# Patient Record
Sex: Female | Born: 1960
Health system: Southern US, Community
[De-identification: ages and names within clinical notes are randomized; demographics above are authoritative.]

## PROBLEM LIST (undated history)

## (undated) DIAGNOSIS — E785 Hyperlipidemia, unspecified: Secondary | ICD-10-CM

## (undated) DIAGNOSIS — E039 Hypothyroidism, unspecified: Secondary | ICD-10-CM

## (undated) DIAGNOSIS — IMO0001 Reserved for inherently not codable concepts without codable children: Secondary | ICD-10-CM

## (undated) DIAGNOSIS — K219 Gastro-esophageal reflux disease without esophagitis: Secondary | ICD-10-CM

## (undated) DIAGNOSIS — K449 Diaphragmatic hernia without obstruction or gangrene: Secondary | ICD-10-CM

## (undated) HISTORY — DX: Hypothyroidism, unspecified: E03.9

## (undated) HISTORY — DX: Gastro-esophageal reflux disease without esophagitis: K21.9

## (undated) HISTORY — DX: Hyperlipidemia, unspecified: E78.5

## (undated) HISTORY — DX: Diaphragmatic hernia without obstruction or gangrene: K44.9

## (undated) HISTORY — PX: OTHER SURGICAL HISTORY: SHX169

## (undated) HISTORY — DX: Reserved for inherently not codable concepts without codable children: IMO0001

---

## 1998-06-28 ENCOUNTER — Other Ambulatory Visit: Admission: RE | Admit: 1998-06-28 | Discharge: 1998-06-28 | Payer: Self-pay | Admitting: Obstetrics and Gynecology

## 1999-07-04 ENCOUNTER — Other Ambulatory Visit: Admission: RE | Admit: 1999-07-04 | Discharge: 1999-07-04 | Payer: Self-pay | Admitting: Obstetrics and Gynecology

## 1999-12-04 ENCOUNTER — Encounter: Admission: RE | Admit: 1999-12-04 | Discharge: 1999-12-04 | Payer: Self-pay | Admitting: Family Medicine

## 1999-12-04 ENCOUNTER — Encounter: Payer: Self-pay | Admitting: Family Medicine

## 2000-07-07 ENCOUNTER — Other Ambulatory Visit: Admission: RE | Admit: 2000-07-07 | Discharge: 2000-07-07 | Payer: Self-pay | Admitting: Obstetrics and Gynecology

## 2001-07-08 ENCOUNTER — Other Ambulatory Visit: Admission: RE | Admit: 2001-07-08 | Discharge: 2001-07-08 | Payer: Self-pay | Admitting: Obstetrics and Gynecology

## 2002-07-13 ENCOUNTER — Other Ambulatory Visit: Admission: RE | Admit: 2002-07-13 | Discharge: 2002-07-13 | Payer: Self-pay | Admitting: Obstetrics and Gynecology

## 2003-07-24 ENCOUNTER — Other Ambulatory Visit: Admission: RE | Admit: 2003-07-24 | Discharge: 2003-07-24 | Payer: Self-pay | Admitting: Obstetrics and Gynecology

## 2004-09-11 ENCOUNTER — Other Ambulatory Visit: Admission: RE | Admit: 2004-09-11 | Discharge: 2004-09-11 | Payer: Self-pay | Admitting: Obstetrics and Gynecology

## 2005-09-12 ENCOUNTER — Other Ambulatory Visit: Admission: RE | Admit: 2005-09-12 | Discharge: 2005-09-12 | Payer: Self-pay | Admitting: Obstetrics and Gynecology

## 2006-04-21 ENCOUNTER — Ambulatory Visit: Payer: Self-pay | Admitting: Family Medicine

## 2006-06-09 ENCOUNTER — Ambulatory Visit: Payer: Self-pay | Admitting: Family Medicine

## 2006-09-17 ENCOUNTER — Other Ambulatory Visit: Admission: RE | Admit: 2006-09-17 | Discharge: 2006-09-17 | Payer: Self-pay | Admitting: Obstetrics and Gynecology

## 2007-08-04 ENCOUNTER — Ambulatory Visit: Payer: Self-pay | Admitting: Family Medicine

## 2007-09-07 ENCOUNTER — Ambulatory Visit: Payer: Self-pay | Admitting: Family Medicine

## 2007-09-21 ENCOUNTER — Other Ambulatory Visit: Admission: RE | Admit: 2007-09-21 | Discharge: 2007-09-21 | Payer: Self-pay | Admitting: Obstetrics and Gynecology

## 2007-11-17 ENCOUNTER — Ambulatory Visit: Payer: Self-pay | Admitting: Family Medicine

## 2008-03-27 ENCOUNTER — Ambulatory Visit: Payer: Self-pay | Admitting: Family Medicine

## 2008-04-03 ENCOUNTER — Ambulatory Visit: Payer: Self-pay | Admitting: Family Medicine

## 2008-10-13 ENCOUNTER — Encounter: Payer: Self-pay | Admitting: Obstetrics and Gynecology

## 2008-10-13 ENCOUNTER — Other Ambulatory Visit: Admission: RE | Admit: 2008-10-13 | Discharge: 2008-10-13 | Payer: Self-pay | Admitting: Obstetrics and Gynecology

## 2008-10-13 ENCOUNTER — Ambulatory Visit: Payer: Self-pay | Admitting: Obstetrics and Gynecology

## 2008-11-09 ENCOUNTER — Ambulatory Visit: Payer: Self-pay | Admitting: Family Medicine

## 2009-10-15 ENCOUNTER — Other Ambulatory Visit: Admission: RE | Admit: 2009-10-15 | Discharge: 2009-10-15 | Payer: Self-pay | Admitting: Obstetrics and Gynecology

## 2009-10-15 ENCOUNTER — Ambulatory Visit: Payer: Self-pay | Admitting: Obstetrics and Gynecology

## 2009-12-06 ENCOUNTER — Ambulatory Visit: Payer: Self-pay | Admitting: Family Medicine

## 2010-03-01 ENCOUNTER — Ambulatory Visit: Payer: Self-pay | Admitting: Family Medicine

## 2010-11-06 ENCOUNTER — Other Ambulatory Visit: Payer: Self-pay | Admitting: Obstetrics and Gynecology

## 2010-11-06 ENCOUNTER — Other Ambulatory Visit (HOSPITAL_COMMUNITY)
Admission: RE | Admit: 2010-11-06 | Discharge: 2010-11-06 | Disposition: A | Discharge status: Home / Self Care | Payer: BC Managed Care – PPO | Source: Ambulatory Visit | Attending: Obstetrics and Gynecology | Admitting: Obstetrics and Gynecology

## 2010-11-06 ENCOUNTER — Encounter (INDEPENDENT_AMBULATORY_CARE_PROVIDER_SITE_OTHER): Payer: BC Managed Care – PPO | Admitting: Obstetrics and Gynecology

## 2010-11-06 DIAGNOSIS — R82998 Other abnormal findings in urine: Secondary | ICD-10-CM

## 2010-11-06 DIAGNOSIS — Z01419 Encounter for gynecological examination (general) (routine) without abnormal findings: Secondary | ICD-10-CM

## 2010-11-06 DIAGNOSIS — Z124 Encounter for screening for malignant neoplasm of cervix: Secondary | ICD-10-CM | POA: Insufficient documentation

## 2010-11-15 ENCOUNTER — Other Ambulatory Visit (INDEPENDENT_AMBULATORY_CARE_PROVIDER_SITE_OTHER): Payer: BC Managed Care – PPO

## 2010-11-15 DIAGNOSIS — N951 Menopausal and female climacteric states: Secondary | ICD-10-CM

## 2011-03-27 ENCOUNTER — Other Ambulatory Visit: Payer: Self-pay | Admitting: Family Medicine

## 2011-04-02 ENCOUNTER — Other Ambulatory Visit (INDEPENDENT_AMBULATORY_CARE_PROVIDER_SITE_OTHER): Payer: BC Managed Care – PPO

## 2011-04-02 DIAGNOSIS — N951 Menopausal and female climacteric states: Secondary | ICD-10-CM

## 2011-04-02 DIAGNOSIS — Z833 Family history of diabetes mellitus: Secondary | ICD-10-CM

## 2011-04-04 ENCOUNTER — Other Ambulatory Visit (INDEPENDENT_AMBULATORY_CARE_PROVIDER_SITE_OTHER): Payer: BC Managed Care – PPO

## 2011-04-04 ENCOUNTER — Other Ambulatory Visit: Payer: BC Managed Care – PPO

## 2011-04-04 ENCOUNTER — Other Ambulatory Visit: Payer: Self-pay | Admitting: Gynecology

## 2011-04-04 DIAGNOSIS — E079 Disorder of thyroid, unspecified: Secondary | ICD-10-CM

## 2011-04-04 LAB — T4: T4, Total: 8.1 ug/dL (ref 5.0–12.5)

## 2011-04-04 LAB — TSH: TSH: 6.07 u[IU]/mL — ABNORMAL HIGH (ref 0.350–4.500)

## 2011-04-04 LAB — T3 UPTAKE: T3 Uptake: 30.5 % (ref 22.5–37.0)

## 2011-04-07 LAB — THYROID PEROXIDASE ANTIBODY: Thyroperoxidase Ab SerPl-aCnc: 1408 IU/mL — ABNORMAL HIGH (ref <35.0)

## 2011-04-08 ENCOUNTER — Encounter: Payer: Self-pay | Admitting: *Deleted

## 2011-04-08 ENCOUNTER — Encounter: Payer: Self-pay | Admitting: Obstetrics and Gynecology

## 2011-04-09 ENCOUNTER — Ambulatory Visit (INDEPENDENT_AMBULATORY_CARE_PROVIDER_SITE_OTHER): Payer: BC Managed Care – PPO | Admitting: Obstetrics and Gynecology

## 2011-04-09 ENCOUNTER — Encounter: Payer: Self-pay | Admitting: Obstetrics and Gynecology

## 2011-04-09 DIAGNOSIS — E039 Hypothyroidism, unspecified: Secondary | ICD-10-CM

## 2011-04-09 NOTE — Progress Notes (Signed)
  The patient came to see me today at my request after reviewing her lab work. She been having persistent hot flashes on her birth control pills and was concerned that she may have a thyroid disorder. Her FSH was done on the placebo week of pills and was normal. Her blood sugar was checked and she is not diabetic. Her TSH was slightly elevated at 9.48.a thyroid panel was then done up to look as well as thyroid antibodies. Her TSH remained slightly elevated but her thyroid antibodies were significantly elevated. She came in today and we had a very long discussion about the above. She was treated with Synthroid 50 MCG is one daily. We discussed the fact that her hot flashes may or may not respond to this we could alter her estrogen if they do not. She will return in 12 weeks for a TSH rechecked.

## 2011-04-23 ENCOUNTER — Other Ambulatory Visit: Payer: BC Managed Care – PPO

## 2011-06-26 ENCOUNTER — Other Ambulatory Visit: Payer: Self-pay | Admitting: Family Medicine

## 2011-06-27 ENCOUNTER — Telehealth: Payer: Self-pay | Admitting: *Deleted

## 2011-06-27 MED ORDER — LEVOTHYROXINE SODIUM 50 MCG PO TABS: 50.0000 ug | ORAL_TABLET | Freq: Every day | ORAL | Status: DC

## 2011-06-27 NOTE — Telephone Encounter (Signed)
Okay to refill thyroid. I have not been giving her zocor. Who prescribed it?

## 2011-06-27 NOTE — Telephone Encounter (Signed)
Patient is due to come back in and recheck her TSH (coming in Oct 23).  Going to run out of med before then can we call in one refill? Also, her generic Zocor has no refills either, can she fill till AEX?  Didn't see where she needed to recheck, please advise.

## 2011-06-27 NOTE — Telephone Encounter (Signed)
Refilled thyroid.  Realized according to chart, Dr. Susann Givens has RX'd for her.  Left message for patient that thyroid med called in and needs to get refill on generic Zocor with Dr. Susann Givens.

## 2011-07-08 ENCOUNTER — Ambulatory Visit (INDEPENDENT_AMBULATORY_CARE_PROVIDER_SITE_OTHER): Payer: BC Managed Care – PPO | Admitting: *Deleted

## 2011-07-08 DIAGNOSIS — E039 Hypothyroidism, unspecified: Secondary | ICD-10-CM

## 2011-07-10 MED ORDER — LEVOTHYROXINE SODIUM 75 MCG PO TABS: 75.0000 ug | ORAL_TABLET | Freq: Every day | ORAL | Status: DC

## 2011-07-29 ENCOUNTER — Encounter: Payer: Self-pay | Admitting: Family Medicine

## 2011-07-30 ENCOUNTER — Ambulatory Visit (INDEPENDENT_AMBULATORY_CARE_PROVIDER_SITE_OTHER): Payer: BC Managed Care – PPO | Admitting: Family Medicine

## 2011-07-30 ENCOUNTER — Encounter: Payer: Self-pay | Admitting: Family Medicine

## 2011-07-30 VITALS — BP 120/80 | HR 80 | Wt 123.0 lb

## 2011-07-30 DIAGNOSIS — Z79899 Other long term (current) drug therapy: Secondary | ICD-10-CM

## 2011-07-30 DIAGNOSIS — K219 Gastro-esophageal reflux disease without esophagitis: Secondary | ICD-10-CM

## 2011-07-30 DIAGNOSIS — Z23 Encounter for immunization: Secondary | ICD-10-CM

## 2011-07-30 DIAGNOSIS — E785 Hyperlipidemia, unspecified: Secondary | ICD-10-CM

## 2011-07-30 DIAGNOSIS — E039 Hypothyroidism, unspecified: Secondary | ICD-10-CM

## 2011-07-30 DIAGNOSIS — D649 Anemia, unspecified: Secondary | ICD-10-CM | POA: Insufficient documentation

## 2011-07-30 LAB — CBC WITH DIFFERENTIAL/PLATELET
Basophils Absolute: 0 10*3/uL (ref 0.0–0.1)
Basophils Relative: 0 % (ref 0–1)
Eosinophils Relative: 4 % (ref 0–5)
HCT: 40.9 % (ref 36.0–46.0)
Hemoglobin: 13.5 g/dL (ref 12.0–15.0)
Lymphocytes Relative: 22 % (ref 12–46)
MCHC: 33 g/dL (ref 30.0–36.0)
MCV: 95.1 fL (ref 78.0–100.0)
Monocytes Absolute: 0.8 10*3/uL (ref 0.1–1.0)
Monocytes Relative: 9 % (ref 3–12)
RDW: 13.6 % (ref 11.5–15.5)

## 2011-07-30 LAB — LIPID PANEL
HDL: 75 mg/dL (ref >39)
Total CHOL/HDL Ratio: 2.2 Ratio
Triglycerides: 92 mg/dL (ref <150)

## 2011-07-30 LAB — COMPREHENSIVE METABOLIC PANEL
AST: 23 U/L (ref 0–37)
Albumin: 4.3 g/dL (ref 3.5–5.2)
BUN: 17 mg/dL (ref 6–23)
Calcium: 9.7 mg/dL (ref 8.4–10.5)
Chloride: 103 mEq/L (ref 96–112)
Creat: 0.72 mg/dL (ref 0.50–1.10)
Glucose, Bld: 91 mg/dL (ref 70–99)
Potassium: 4.6 mEq/L (ref 3.5–5.3)

## 2011-07-30 NOTE — Progress Notes (Signed)
  Subjective:    Patient ID: Bonnie Hensley, female    DOB: 1961-03-06, 50 y.o.   MRN: [id number]  HPI She is here for a medication check. She continues on her present medications and is having no difficulty with them. She does occasionally have reflux in response to Pepcid. She has a previous history of anemia and does take iron fairly regularly. Work is going fairly well. Her marriage life is also going quite well. She has no other concerns or complaints.   Review of Systems     Objective:   Physical Exam alert and in no distress. Tympanic membranes and canals are normal. Throat is clear. Tonsils are normal. Neck is supple without adenopathy or thyromegaly. Cardiac exam shows a regular sinus rhythm without murmurs or gallops. Lungs are clear to auscultation. Reflexes are normal        Assessment & Plan:   1. Hyperlipidemia LDL goal < 100  Lipid panel  2. Hypothyroid  TSH  3. GERD (gastroesophageal reflux disease)    4. Anemia  CBC with Differential  5. Encounter for long-term (current) use of other medications  CBC with Differential, Comprehensive metabolic panel, Lipid panel

## 2011-07-30 NOTE — Patient Instructions (Signed)
Continue to take good care of yourself 

## 2011-07-31 LAB — TSH: TSH: 0.491 u[IU]/mL (ref 0.350–4.500)

## 2011-09-22 ENCOUNTER — Encounter: Payer: Self-pay | Admitting: Medical

## 2011-09-22 ENCOUNTER — Other Ambulatory Visit: Payer: Self-pay | Admitting: Family Medicine

## 2011-09-22 ENCOUNTER — Ambulatory Visit (INDEPENDENT_AMBULATORY_CARE_PROVIDER_SITE_OTHER): Payer: BC Managed Care – PPO | Admitting: Medical

## 2011-09-22 VITALS — BP 120/80 | HR 88 | Temp 98.7°F | Resp 14 | Wt 121.0 lb

## 2011-09-22 DIAGNOSIS — J40 Bronchitis, not specified as acute or chronic: Secondary | ICD-10-CM

## 2011-09-22 MED ORDER — AMOXICILLIN 875 MG PO TABS: 875.0000 mg | ORAL_TABLET | Freq: Two times a day (BID) | ORAL | Status: AC

## 2011-09-22 NOTE — Patient Instructions (Signed)
Acute Bronchitis Bronchitis is a problem of the air tubes leading to your lungs. Acute means the illness started quickly. In this condition, the lining of those tubes becomes puffy (swollen) and can leak fluid. This makes it harder for air to get in and out of your lungs. You may cough a lot. This is because the air tubes are narrow. Bronchitis is most often caused by a virus. Medicines that kill germs (antibiotics) may be needed with germ (bacteria) infections for people who:  Smoke.   Have lasting (chronic) lung problems.   Are elderly.  HOME CARE  Rest.   Drink enough water and fluids to keep the pee clear or pale yellow.   Only take medicine as told by your doctor.   Medicines may be prescribed that will open up the airways. This will help make breathing easier.   Bronchitis usually gets better on its own in a few days.  Recovery from some problems (symptoms) of bronchitis may be slow. You should start feeling a little better after 2 to 3 days. Coughing may last for 3 to 4 weeks. GET HELP RIGHT AWAY IF:  You or your child has a temperature by mouth above 101, not controlled by medicine.   Chills or chest pain develops.   You or your child develops very bad shortness of breath.   There is bloody saliva mixed with mucus (sputum).   You or your child throws up (vomits) often, loses too much fluid (dehydration), feels faint, or has a very bad headache.   You or your child does not improve after 1 week of treatment.  MAKE SURE YOU:   Understand these instructions.   Will watch this condition.   Will get help right away if you or your child is not doing well or gets worse.  Document Released: 02/18/2008 Document Re-Released: 11/26/2009 [employer] Patient Information 2011 [employer].  

## 2011-09-22 NOTE — Progress Notes (Signed)
Subjective:  Bonnie Hensley is a 51 y.o. female who presents for cough and congestion that began just before Christmas, then started having flu like symptoms over the following weekend.  Things seemed to resolve last then started getting bad cough within the last week that is worsening.  She currently notes chest heaviness, tired, sinus pressure, mild nasal drainage.  Cough is productive but is swallowing this.  Denies belly pain, but some nausea.   No vomiting, but mild diarrhea.  No sick contacts. She did have a flu vaccine this year.  She is a nonsmoker.  Denies fever, sore, ear pain.  Using Hauls cough drops.  No other aggravating or relieving factors.  No other c/o.  The following portions of the patient's history were reviewed and updated as appropriate: allergies, current medications, past family history, past medical history, past social history, past surgical history and problem list.  Past Medical History  Diagnosis Date  . Dyslipidemia   . HH (hiatus hernia)   . Hypothyroidism      Objective:   Filed Vitals:   09/22/11 1140  BP: 120/80  Pulse: 88  Temp: 98.7 F (37.1 C)  Resp: 14    General appearance: Alert, WD/WN, no distress, ill appearing                             Skin: warm, no rash, no diaphoresis                           Head: no sinus tenderness                            Eyes: conjunctiva normal, corneas clear, PERRLA                            Ears: pearly TMs, external ear canals normal                          Nose: septum midline, turbinates swollen, with erythema and clear discharge             Mouth/throat: MMM, tongue normal, mild pharyngeal erythema                           Neck: supple, no adenopathy, no thyromegaly, nontender                          Heart: RRR, normal S1, S2, no murmurs                         Lungs: +bronchial breath sounds, no rhonchi, no wheezes, no rales                Extremities: no edema, nontender     Assessment and  Plan:   Encounter Diagnosis  Name Primary?  . Bronchitis Yes   Prescription given today for Amoxicillin as below.  Discussed diagnosis and treatment of bronchitis.  Suggested symptomatic OTC remedies for cough and congestion.  Nasal saline spray for nasal congestion.  Tylenol or Ibuprofen OTC for fever and malaise.  Call/return in 2-3 days if symptoms are worse or not improving.  Advised that cough may linger even after the infection is  improved.

## 2011-10-24 ENCOUNTER — Telehealth: Payer: Self-pay | Admitting: *Deleted

## 2011-10-24 MED ORDER — LEVOTHYROXINE SODIUM 75 MCG PO TABS: 75.0000 ug | ORAL_TABLET | Freq: Every day | ORAL | Status: DC

## 2011-10-24 NOTE — Telephone Encounter (Signed)
rx sent

## 2011-11-11 ENCOUNTER — Encounter: Payer: Self-pay | Admitting: Obstetrics and Gynecology

## 2011-11-11 ENCOUNTER — Other Ambulatory Visit (HOSPITAL_COMMUNITY)
Admission: RE | Admit: 2011-11-11 | Discharge: 2011-11-11 | Disposition: A | Discharge status: Home / Self Care | Payer: BC Managed Care – PPO | Source: Ambulatory Visit | Attending: Obstetrics and Gynecology | Admitting: Obstetrics and Gynecology

## 2011-11-11 ENCOUNTER — Ambulatory Visit (INDEPENDENT_AMBULATORY_CARE_PROVIDER_SITE_OTHER): Payer: BC Managed Care – PPO | Admitting: Obstetrics and Gynecology

## 2011-11-11 VITALS — BP 112/64 | Ht 64.0 in | Wt 124.0 lb

## 2011-11-11 DIAGNOSIS — Z780 Asymptomatic menopausal state: Secondary | ICD-10-CM

## 2011-11-11 DIAGNOSIS — Z01419 Encounter for gynecological examination (general) (routine) without abnormal findings: Secondary | ICD-10-CM

## 2011-11-11 DIAGNOSIS — E039 Hypothyroidism, unspecified: Secondary | ICD-10-CM

## 2011-11-11 DIAGNOSIS — N951 Menopausal and female climacteric states: Secondary | ICD-10-CM

## 2011-11-11 LAB — FOLLICLE STIMULATING HORMONE: FSH: 0.4 m[IU]/mL

## 2011-11-11 LAB — TSH: TSH: 6.977 u[IU]/mL — ABNORMAL HIGH (ref 0.350–4.500)

## 2011-11-11 MED ORDER — LEVOTHYROXINE SODIUM 75 MCG PO TABS: 75.0000 ug | ORAL_TABLET | Freq: Every day | ORAL | Status: DC

## 2011-11-11 MED ORDER — NORETHINDRONE-ETH ESTRADIOL 1-35 MG-MCG PO TABS: 1.0000 | ORAL_TABLET | Freq: Every day | ORAL | Status: DC

## 2011-11-11 NOTE — Progress Notes (Signed)
Patient came back to see me today for her annual GYN exam. She remains on birth control pills with minimal periods. She is scheduled for yearly mammogram for next month. Last year when we diagnosed her with hypothyroidism it was due to hot  Flashes. A placebo week FSH had been normal. She is now on her placebo pills and is experiencing flushing of her face. Her mother died of breast cancer in her 30s. As far as the patient knows there is no other family history of breast or ovarian cancer. She is going to call her maternal aunt to verify that. She is having no pelvic pain. She is being treated for her lipids by her PCP. During the year we boosted her synthroid to 67 with elimination of hot flashed.  Physical examination: Kennon Portela present HEENT within normal limits. Neck: Thyroid not large. No masses. Supraclavicular nodes: not enlarged. Breasts: Examined in both sitting midline position. No skin changes and no masses. Abdomen: Soft no guarding rebound or masses or hernia. Pelvic: External: Within normal limits. BUS: Within normal limits. Vaginal:within normal limits. Good estrogen effect. No evidence of cystocele rectocele or enterocele. Cervix: clean. Uterus: Normal size and shape. Adnexa: No masses. Rectovaginal exam: Confirmatory and negative. Extremities: Within normal limits.  Assessment: Hypothyroidism. Mother with early onset breast cancer. Face flushing this  Cycle.  Plan: Mammogram. Patient to recheck family history. FSH and TSH drawn today. For the moment continue both birth control pills and current Synthroid dose.

## 2011-11-12 LAB — URINALYSIS W MICROSCOPIC + REFLEX CULTURE
Bacteria, UA: NONE SEEN
Bilirubin Urine: NEGATIVE
Casts: NONE SEEN
Crystals: NONE SEEN
Glucose, UA: NEGATIVE mg/dL
Hgb urine dipstick: NEGATIVE
Ketones, ur: NEGATIVE mg/dL
Specific Gravity, Urine: 1.005 (ref 1.005–1.030)
pH: 7 (ref 5.0–8.0)

## 2011-11-13 LAB — URINE CULTURE

## 2011-11-17 ENCOUNTER — Telehealth: Payer: Self-pay | Admitting: *Deleted

## 2011-11-17 ENCOUNTER — Other Ambulatory Visit: Payer: Self-pay | Admitting: *Deleted

## 2011-11-17 DIAGNOSIS — E039 Hypothyroidism, unspecified: Secondary | ICD-10-CM

## 2011-11-17 MED ORDER — LEVOTHYROXINE SODIUM 88 MCG PO TABS: 88.0000 ug | ORAL_TABLET | Freq: Every day | ORAL | Status: DC

## 2011-11-17 NOTE — Telephone Encounter (Signed)
Patient originally called into office and was talking about problems with hotflashes and phone was disconnected.  Attempted to reach patient but had to leave a message for patient to call back.

## 2011-11-17 NOTE — Telephone Encounter (Signed)
Patient informed and rx sent in

## 2011-11-17 NOTE — Telephone Encounter (Signed)
Over the weekend hotflashes were really crazy.  Has decided she does want to go ahead and increase her thyroid med a notch if ok

## 2011-11-17 NOTE — Telephone Encounter (Signed)
Increase dose to daily. Recheck TSH in 10 weeks.

## 2011-12-04 ENCOUNTER — Encounter: Payer: Self-pay | Admitting: Obstetrics and Gynecology

## 2011-12-08 ENCOUNTER — Other Ambulatory Visit: Payer: Self-pay

## 2011-12-08 DIAGNOSIS — N62 Hypertrophy of breast: Secondary | ICD-10-CM

## 2011-12-09 ENCOUNTER — Other Ambulatory Visit: Payer: Self-pay | Admitting: Obstetrics and Gynecology

## 2011-12-09 DIAGNOSIS — N62 Hypertrophy of breast: Secondary | ICD-10-CM

## 2011-12-22 ENCOUNTER — Telehealth: Payer: Self-pay | Admitting: Internal Medicine

## 2011-12-22 MED ORDER — SIMVASTATIN 20 MG PO TABS: 20.0000 mg | ORAL_TABLET | Freq: Every day | ORAL | Status: DC

## 2011-12-22 NOTE — Telephone Encounter (Signed)
Med sent in.

## 2012-01-21 ENCOUNTER — Other Ambulatory Visit: Payer: Self-pay | Admitting: Obstetrics and Gynecology

## 2012-02-26 ENCOUNTER — Other Ambulatory Visit: Payer: BC Managed Care – PPO

## 2012-02-26 DIAGNOSIS — E039 Hypothyroidism, unspecified: Secondary | ICD-10-CM

## 2012-02-26 LAB — TSH: TSH: 1.589 u[IU]/mL (ref 0.350–4.500)

## 2012-02-27 ENCOUNTER — Other Ambulatory Visit: Payer: Self-pay | Admitting: *Deleted

## 2012-02-27 MED ORDER — LEVOTHYROXINE SODIUM 88 MCG PO TABS: 88.0000 ug | ORAL_TABLET | Freq: Every day | ORAL | Status: DC

## 2012-03-22 ENCOUNTER — Other Ambulatory Visit: Payer: Self-pay | Admitting: Family Medicine

## 2012-06-15 ENCOUNTER — Other Ambulatory Visit: Payer: Self-pay | Admitting: Family Medicine

## 2012-06-16 NOTE — Telephone Encounter (Signed)
PATIENT NEEDS TO SCHEDULE A OFFICE TO BE SEEN BEFORE HER MEDICATIONS RUNS OUT.

## 2012-09-19 ENCOUNTER — Other Ambulatory Visit: Payer: Self-pay | Admitting: Family Medicine

## 2012-11-12 ENCOUNTER — Other Ambulatory Visit: Payer: Self-pay | Admitting: Obstetrics and Gynecology

## 2012-12-14 ENCOUNTER — Other Ambulatory Visit: Payer: Self-pay | Admitting: Family Medicine

## 2013-02-16 ENCOUNTER — Telehealth: Payer: Self-pay | Admitting: Family Medicine

## 2013-02-16 NOTE — Telephone Encounter (Signed)
LEFT WORD FOR WORD MESSAGE FOR PT 

## 2013-02-16 NOTE — Telephone Encounter (Signed)
Let her know that not all insurance company's cover it until age 54 or higher. Tell her that I think it's a good shot and have her check with her insurance to see if it will be covered

## 2013-06-10 ENCOUNTER — Other Ambulatory Visit: Payer: Self-pay | Admitting: Family Medicine

## 2013-09-06 ENCOUNTER — Telehealth: Payer: Self-pay | Admitting: Family Medicine

## 2013-09-06 ENCOUNTER — Other Ambulatory Visit: Payer: Self-pay

## 2013-09-06 MED ORDER — SIMVASTATIN 20 MG PO TABS: 20.0000 mg | ORAL_TABLET | Freq: Every day | ORAL | Status: DC

## 2013-09-06 NOTE — Telephone Encounter (Signed)
MED SENT 

## 2013-09-06 NOTE — Telephone Encounter (Signed)
Medication SENT TO PHARMACY WITH 1 REFILL

## 2013-09-13 ENCOUNTER — Other Ambulatory Visit: Payer: Self-pay | Admitting: Family Medicine

## 2013-10-18 ENCOUNTER — Ambulatory Visit (INDEPENDENT_AMBULATORY_CARE_PROVIDER_SITE_OTHER): Payer: BC Managed Care – PPO | Admitting: Family Medicine

## 2013-10-18 ENCOUNTER — Encounter: Payer: Self-pay | Admitting: Family Medicine

## 2013-10-18 VITALS — BP 114/76 | HR 96 | Ht 64.0 in | Wt 124.0 lb

## 2013-10-18 DIAGNOSIS — E785 Hyperlipidemia, unspecified: Secondary | ICD-10-CM

## 2013-10-18 DIAGNOSIS — Z2911 Encounter for prophylactic immunotherapy for respiratory syncytial virus (RSV): Secondary | ICD-10-CM

## 2013-10-18 DIAGNOSIS — K219 Gastro-esophageal reflux disease without esophagitis: Secondary | ICD-10-CM

## 2013-10-18 DIAGNOSIS — D649 Anemia, unspecified: Secondary | ICD-10-CM

## 2013-10-18 DIAGNOSIS — Z79899 Other long term (current) drug therapy: Secondary | ICD-10-CM

## 2013-10-18 DIAGNOSIS — E039 Hypothyroidism, unspecified: Secondary | ICD-10-CM

## 2013-10-18 LAB — COMPREHENSIVE METABOLIC PANEL
ALT: 12 U/L (ref 0–35)
AST: 16 U/L (ref 0–37)
Albumin: 4.1 g/dL (ref 3.5–5.2)
Alkaline Phosphatase: 59 U/L (ref 39–117)
BILIRUBIN TOTAL: 0.4 mg/dL (ref 0.2–1.2)
BUN: 15 mg/dL (ref 6–23)
CO2: 24 meq/L (ref 19–32)
CREATININE: 0.79 mg/dL (ref 0.50–1.10)
Calcium: 9.7 mg/dL (ref 8.4–10.5)
Chloride: 105 mEq/L (ref 96–112)
Glucose, Bld: 89 mg/dL (ref 70–99)
Potassium: 4.3 mEq/L (ref 3.5–5.3)
Sodium: 139 mEq/L (ref 135–145)
Total Protein: 7 g/dL (ref 6.0–8.3)

## 2013-10-18 LAB — CBC WITH DIFFERENTIAL/PLATELET
BASOS ABS: 0.1 10*3/uL (ref 0.0–0.1)
Basophils Relative: 1 % (ref 0–1)
EOS ABS: 0.2 10*3/uL (ref 0.0–0.7)
EOS PCT: 2 % (ref 0–5)
HCT: 41.6 % (ref 36.0–46.0)
Hemoglobin: 13.9 g/dL (ref 12.0–15.0)
Lymphocytes Relative: 19 % (ref 12–46)
Lymphs Abs: 2 10*3/uL (ref 0.7–4.0)
MCH: 30.7 pg (ref 26.0–34.0)
MCHC: 33.4 g/dL (ref 30.0–36.0)
MCV: 91.8 fL (ref 78.0–100.0)
Monocytes Absolute: 0.7 10*3/uL (ref 0.1–1.0)
Monocytes Relative: 7 % (ref 3–12)
Neutro Abs: 7.2 10*3/uL (ref 1.7–7.7)
Neutrophils Relative %: 71 % (ref 43–77)
PLATELETS: 337 10*3/uL (ref 150–400)
RBC: 4.53 MIL/uL (ref 3.87–5.11)
RDW: 13.6 % (ref 11.5–15.5)
WBC: 10.2 10*3/uL (ref 4.0–10.5)

## 2013-10-18 LAB — LIPID PANEL
CHOL/HDL RATIO: 2.1 ratio
CHOLESTEROL: 155 mg/dL (ref 0–200)
HDL: 73 mg/dL (ref >39)
LDL Cholesterol: 75 mg/dL (ref 0–99)
Triglycerides: 36 mg/dL (ref <150)
VLDL: 7 mg/dL (ref 0–40)

## 2013-10-18 MED ORDER — SIMVASTATIN 20 MG PO TABS: ORAL_TABLET | ORAL | Status: DC

## 2013-10-18 MED ORDER — LEVOTHYROXINE SODIUM 88 MCG PO TABS: 88.0000 ug | ORAL_TABLET | Freq: Every day | ORAL | Status: DC

## 2013-10-18 NOTE — Addendum Note (Signed)
Addended by: Jeri CosELLEBY, Mionna Advincula S on: 10/18/2013 02:18 PM   Modules accepted: Orders

## 2013-10-18 NOTE — Progress Notes (Signed)
   Subjective:    Patient ID: Bonnie Hensley, female    DOB: [birth year], 53 y.o.   MRN: [id number]  HPI She is here for a med check. She takes excellent care of herself. She exercises regularly and drinks very little. Does not smoke. Her work and home life are going quite well. She does have a history of anemia and is on iron supplementation. She has very little difficulty with reflux and does use Pepcid on an as-needed basis she continues on Zocor as well as Synthroid and is having no difficulty with them. She has no other concerns or complaints.   Review of Systems     Objective:   Physical Exam alert and in no distress. Tympanic membranes and canals are normal. Throat is clear. Tonsils are normal. Neck is supple without adenopathy or thyromegaly. Cardiac exam shows a regular sinus rhythm without murmurs or gallops. Lungs are clear to auscultation. DTRs normal.        Assessment & Plan:  Anemia - Plan: CBC with Differential, Comprehensive metabolic panel  GERD (gastroesophageal reflux disease)  Hyperlipidemia LDL goal < 100 - Plan: Lipid panel, simvastatin (ZOCOR) 20 MG tablet  Hypothyroid - Plan: TSH, levothyroxine (SYNTHROID, LEVOTHROID) 88 MCG tablet  Encounter for long-term (current) use of other medications - Plan: CBC with Differential, Comprehensive metabolic panel, Lipid panel, TSH  I encouraged her to continue to take good care of herself

## 2013-10-19 LAB — TSH: TSH: 1.125 u[IU]/mL (ref 0.350–4.500)

## 2013-12-01 ENCOUNTER — Other Ambulatory Visit: Payer: Self-pay | Admitting: Obstetrics and Gynecology

## 2013-12-01 LAB — HM MAMMOGRAPHY

## 2014-07-05 ENCOUNTER — Encounter: Payer: Self-pay | Admitting: Family Medicine

## 2014-07-05 ENCOUNTER — Ambulatory Visit (INDEPENDENT_AMBULATORY_CARE_PROVIDER_SITE_OTHER): Payer: BC Managed Care – PPO | Admitting: Family Medicine

## 2014-07-05 VITALS — BP 110/70 | HR 93 | Wt 124.0 lb

## 2014-07-05 DIAGNOSIS — J300 Vasomotor rhinitis: Secondary | ICD-10-CM

## 2014-07-05 DIAGNOSIS — Z23 Encounter for immunization: Secondary | ICD-10-CM

## 2014-07-05 DIAGNOSIS — R52 Pain, unspecified: Secondary | ICD-10-CM

## 2014-07-05 NOTE — Progress Notes (Signed)
   Subjective:    Patient ID: Bonnie Hensley, female    DOB: [birth year], 53 y.o.   MRN: [id number]  HPI She is here for evaluation of a six-month history of groin and inner thigh aching and twinge-like sensation. It is intermittent in nature. It can be on the left or the right hand side or both. She cannot relate this to position, motion, urinary or GI symptoms. She went through menopause 2 years ago. She also has had nasal congestion and rhinorrhea and notes this especially with certain perfumes   Review of Systems     Objective:   Physical Exam Alert and in no distress. Full motion of the hip without pain. Normal motor sensory and DTRs. Abdominal exam shows no lower abdominal tenderness.       Assessment & Plan:   Vasomotor rhinitis  Aching pain  Need for prophylactic vaccination and inoculation against influenza - Plan: Flu Vaccine QUAD 36+ mos IM  I explained the problem with her runny nose and told her that this is not truly an allergic response but more irritant. No particular etiology can be found for the symptoms in her lower abdomen. Explained that I did not think she was at any risk of anything in particular. Recommended watchful waiting and paying attention to any makes this better or worse.

## 2014-08-03 ENCOUNTER — Other Ambulatory Visit: Payer: Self-pay | Admitting: Family Medicine

## 2014-09-19 ENCOUNTER — Other Ambulatory Visit: Payer: Self-pay | Admitting: Family Medicine

## 2014-11-07 ENCOUNTER — Other Ambulatory Visit: Payer: Self-pay | Admitting: Family Medicine

## 2014-11-23 ENCOUNTER — Encounter: Payer: Self-pay | Admitting: Family Medicine

## 2014-11-28 ENCOUNTER — Ambulatory Visit (INDEPENDENT_AMBULATORY_CARE_PROVIDER_SITE_OTHER): Payer: BLUE CROSS/BLUE SHIELD | Admitting: Family Medicine

## 2014-11-28 ENCOUNTER — Encounter: Payer: Self-pay | Admitting: Family Medicine

## 2014-11-28 VITALS — BP 120/70 | HR 90 | Wt 125.0 lb

## 2014-11-28 DIAGNOSIS — R0989 Other specified symptoms and signs involving the circulatory and respiratory systems: Secondary | ICD-10-CM

## 2014-11-28 DIAGNOSIS — F458 Other somatoform disorders: Secondary | ICD-10-CM

## 2014-11-28 DIAGNOSIS — R198 Other specified symptoms and signs involving the digestive system and abdomen: Secondary | ICD-10-CM

## 2014-11-28 NOTE — Progress Notes (Signed)
   Subjective:    Patient ID: Bonnie Hensley, female    DOB: May 09, 1961, 54 y.o.   MRN: [id number]  HPI She presents with a 10 day history of feeling like something is stuck in her throat. She describes the sensation as an annoying constant feeling that seems to improve with eating solid foods and drinking but then returns within minutes after eating. She states the sensation tends to move to different spots in her throat and happens at all times of the day but does not keep her up at night. She also states she is "losing" her voice at times but that this seems to improve with eating. She has a history of reflux and was diagnosed with a hiatal hernia approximately 15 years ago. She has taken Prilosec in the past but stopped 2 years ago.  She also reports occasional dry cough mainly during daytime hours while upright. Denies fever, chills, difficulty swallowing, neck or throat pain, nausea, vomiting, abdominal pain, or changes in bowel habits.    Review of Systems  All other systems reviewed and are negative.      She does not remember having x-rays or EGD.   Physical Exam Alert and in no distress. Tympanic membranes and canals are normal. Throat is clear.  Neck is supple without adenopathy. Cardiac exam shows a regular sinus rhythm without murmurs or gallops. Lungs are clear to auscultation.         Assessment & Plan:   Globus sensation  Observed the patient swallowing water without difficulty. Suspicious for reflux or laryngeal spasm.   Take 2 Prilosec daily in the morning and follow up in one week.

## 2014-12-19 ENCOUNTER — Other Ambulatory Visit: Payer: Self-pay | Admitting: Family Medicine

## 2014-12-22 ENCOUNTER — Telehealth: Payer: Self-pay | Admitting: Genetic Counselor

## 2014-12-22 NOTE — Telephone Encounter (Signed)
Spoke with patient regarding gen counseling appt and she wants to speak with her referring doctor regarding insurance coverage before proceeding with this referral.

## 2014-12-22 NOTE — Telephone Encounter (Signed)
Second call to pt to set up gen counseling appt.  left message.  TG

## 2014-12-29 ENCOUNTER — Encounter: Payer: Self-pay | Admitting: Family Medicine

## 2015-01-18 ENCOUNTER — Other Ambulatory Visit: Payer: Self-pay | Admitting: Family Medicine

## 2015-01-18 ENCOUNTER — Ambulatory Visit (INDEPENDENT_AMBULATORY_CARE_PROVIDER_SITE_OTHER): Payer: BLUE CROSS/BLUE SHIELD | Admitting: Family Medicine

## 2015-01-18 ENCOUNTER — Encounter: Payer: Self-pay | Admitting: Family Medicine

## 2015-01-18 VITALS — BP 120/60 | HR 87 | Wt 124.6 lb

## 2015-01-18 DIAGNOSIS — E038 Other specified hypothyroidism: Secondary | ICD-10-CM | POA: Diagnosis not present

## 2015-01-18 DIAGNOSIS — D509 Iron deficiency anemia, unspecified: Secondary | ICD-10-CM | POA: Diagnosis not present

## 2015-01-18 DIAGNOSIS — K219 Gastro-esophageal reflux disease without esophagitis: Secondary | ICD-10-CM

## 2015-01-18 DIAGNOSIS — E785 Hyperlipidemia, unspecified: Secondary | ICD-10-CM | POA: Diagnosis not present

## 2015-01-18 DIAGNOSIS — Z79899 Other long term (current) drug therapy: Secondary | ICD-10-CM

## 2015-01-18 DIAGNOSIS — Z23 Encounter for immunization: Secondary | ICD-10-CM

## 2015-01-18 LAB — COMPREHENSIVE METABOLIC PANEL
ALK PHOS: 67 U/L (ref 39–117)
ALT: 27 U/L (ref 0–35)
AST: 23 U/L (ref 0–37)
Albumin: 4.3 g/dL (ref 3.5–5.2)
BILIRUBIN TOTAL: 0.5 mg/dL (ref 0.2–1.2)
BUN: 14 mg/dL (ref 6–23)
CO2: 25 mEq/L (ref 19–32)
Calcium: 9.8 mg/dL (ref 8.4–10.5)
Chloride: 103 mEq/L (ref 96–112)
Creat: 0.68 mg/dL (ref 0.50–1.10)
Glucose, Bld: 74 mg/dL (ref 70–99)
POTASSIUM: 4.3 meq/L (ref 3.5–5.3)
SODIUM: 140 meq/L (ref 135–145)
Total Protein: 7.1 g/dL (ref 6.0–8.3)

## 2015-01-18 LAB — LIPID PANEL
CHOL/HDL RATIO: 1.9 ratio
Cholesterol: 145 mg/dL (ref 0–200)
HDL: 78 mg/dL (ref >=46)
LDL Cholesterol: 53 mg/dL (ref 0–99)
Triglycerides: 68 mg/dL (ref <150)
VLDL: 14 mg/dL (ref 0–40)

## 2015-01-18 LAB — CBC WITH DIFFERENTIAL/PLATELET
BASOS ABS: 0.1 10*3/uL (ref 0.0–0.1)
BASOS PCT: 1 % (ref 0–1)
EOS ABS: 0.4 10*3/uL (ref 0.0–0.7)
EOS PCT: 5 % (ref 0–5)
HCT: 41.6 % (ref 36.0–46.0)
HEMOGLOBIN: 14.1 g/dL (ref 12.0–15.0)
Lymphocytes Relative: 27 % (ref 12–46)
Lymphs Abs: 2.4 10*3/uL (ref 0.7–4.0)
MCH: 30.6 pg (ref 26.0–34.0)
MCHC: 33.9 g/dL (ref 30.0–36.0)
MCV: 90.2 fL (ref 78.0–100.0)
MPV: 10.2 fL (ref 8.6–12.4)
Monocytes Absolute: 0.8 10*3/uL (ref 0.1–1.0)
Monocytes Relative: 9 % (ref 3–12)
Neutro Abs: 5.2 10*3/uL (ref 1.7–7.7)
Neutrophils Relative %: 58 % (ref 43–77)
Platelets: 323 10*3/uL (ref 150–400)
RBC: 4.61 MIL/uL (ref 3.87–5.11)
RDW: 12.9 % (ref 11.5–15.5)
WBC: 8.9 10*3/uL (ref 4.0–10.5)

## 2015-01-18 NOTE — Progress Notes (Signed)
   Subjective:    Patient ID: Bonnie Hensley, female    DOB: 02-09-1961, 54 y.o.   MRN: [id number]  HPI She is here for a dedication check. She continues on her thyroid medication without difficulty. She does have a history of reflux disease and has responded quite nicely to Prilosec. She plans uses on an as-needed basis. She has a history of anemia and does take multiple over-the-counter medications. She is also in the process of trying an OTC medication to help with her menopausal symptoms that are essentially interfering with sleep. Her family and social history as well as immunizations and health maintenance was reviewed. Does have a family history of breast cancer and is in the process of getting genetic testing.She takes excellent care of herself and does not smoke. Her marriage is going quite well.   Review of Systems  All other systems reviewed and are negative.      Objective:   Physical Exam Alert and in no distress. Tympanic membranes and canals are normal. Pharyngeal area is normal. Neck is supple without adenopathy or thyromegaly. Cardiac exam shows a regular sinus rhythm without murmurs or gallops. Lungs are clear to auscultation.        Assessment & Plan:  Other specified hypothyroidism  Hyperlipidemia with target LDL less than 100 - Plan: Lipid panel  Gastroesophageal reflux disease without esophagitis  Iron deficiency anemia - Plan: CBC with Differential/Platelet, Comprehensive metabolic panel  Need for prophylactic vaccination with combined diphtheria-tetanus-pertussis (DTP) vaccine - Plan: Tdap vaccine greater than or equal to 7yo IM  Encounter for long-term (current) use of medications - Plan: CBC with Differential/Platelet, Comprehensive metabolic panel, Lipid panel I encouraged her to continue to take good care of herself and use Prilosec as needed.

## 2015-01-19 LAB — TSH: TSH: 0.083 u[IU]/mL — ABNORMAL LOW (ref 0.350–4.500)

## 2015-01-19 NOTE — Addendum Note (Signed)
Addended by: Blase MessHIATT, Sharnee Douglass F on: 01/19/2015 11:35 AM   Modules accepted: Orders

## 2015-01-19 NOTE — Progress Notes (Signed)
   Subjective:    Patient ID: Enid SkeensRebecca G Hatton, female    DOB: May 10, 1961, 54 y.o.   MRN: [id number]  HPI    Review of Systems     Objective:   Physical Exam        Assessment & Plan:   A DTaP/IPV Was given instead of TDaP. I discussed this with Dr. Orvan Falconerampbell. He states that there is no concern over adverse reaction And that this should cover her immunizations status. I will inform the patient of this and report this to the appropriate entity.

## 2015-01-21 MED ORDER — LEVOTHYROXINE SODIUM 75 MCG PO TABS: 75.0000 ug | ORAL_TABLET | Freq: Every day | ORAL | Status: DC

## 2015-01-21 NOTE — Addendum Note (Signed)
Addended by: Ronnald NianLALONDE, JOHN C on: 01/21/2015 02:58 PM   Modules accepted: Orders

## 2015-01-25 ENCOUNTER — Telehealth: Payer: Self-pay | Admitting: Family Medicine

## 2015-01-25 NOTE — Telephone Encounter (Signed)
I reduced her dosing because her thyroid function indicated the need to cut back. So 75 g as correct. Have her recheck here in 2 months.

## 2015-01-25 NOTE — Telephone Encounter (Signed)
Pt rx is Levothyroxine 75 mcg and normally she takes 88 mcg.  Please advise pt [phone]

## 2015-01-25 NOTE — Telephone Encounter (Signed)
Left detailed messaged on pt vm to stay on levothyroxine 75 mcg

## 2015-02-01 ENCOUNTER — Ambulatory Visit (HOSPITAL_BASED_OUTPATIENT_CLINIC_OR_DEPARTMENT_OTHER): Payer: BLUE CROSS/BLUE SHIELD | Admitting: Genetic Counselor

## 2015-02-01 ENCOUNTER — Other Ambulatory Visit: Payer: BLUE CROSS/BLUE SHIELD

## 2015-02-01 DIAGNOSIS — Z803 Family history of malignant neoplasm of breast: Secondary | ICD-10-CM | POA: Diagnosis not present

## 2015-02-01 DIAGNOSIS — Z807 Family history of other malignant neoplasms of lymphoid, hematopoietic and related tissues: Secondary | ICD-10-CM

## 2015-02-01 DIAGNOSIS — Z808 Family history of malignant neoplasm of other organs or systems: Secondary | ICD-10-CM | POA: Diagnosis not present

## 2015-02-01 DIAGNOSIS — Z315 Encounter for genetic counseling: Secondary | ICD-10-CM

## 2015-02-01 NOTE — Progress Notes (Signed)
[facility] Patient Visit  REFERRING PROVIDER: Denita Lung, MD [address] [place], [place] [address]  PRIMARY PROVIDER:  Wyatt Haste, MD  PRIMARY REASON FOR VISIT:  Family history of breast cancer  HISTORY OF PRESENT ILLNESS:   Bonnie Hensley, a 54 y.o. female, was seen for a [facility] cancer genetics consultation at the request of Dr. Redmond School due to a family history of cancer.  Bonnie Hensley presents to clinic today to discuss the possibility of a hereditary predisposition to cancer, genetic testing, and to further clarify her future cancer risks, as well as potential cancer risks for family members.   CANCER HISTORY:  No personal history of cancer.  HORMONAL RISK FACTORS / CANCER SCREENING:  Menarche was at age 64.  First live birth: NA.  OCP use for approximately > 20 years years.  Ovaries intact: yes.  Hysterectomy: no.  Menopausal status: postmenopausal.  Colonoscopy: yes; normal. Mammogram within the last year: yes. Number of breast biopsies: 0. Up to date with pelvic exams:  yes. Any excessive radiation exposure in the past:  no  Past Medical History  Diagnosis Date   Dyslipidemia    HH (hiatus hernia)    Hypothyroidism    Reflux     Past Surgical History  Procedure Laterality Date   Egg donation      History   Social History   Marital Status: Married    Spouse Name: N/A   Number of Children: N/A   Years of Education: N/A   Social History Main Topics   Smoking status: Never Smoker    Smokeless tobacco: Never Used   Alcohol Use: 1.0 oz/week    2 Standard drinks or equivalent per week   Drug Use: No   Sexual Activity:    Partners: Male    Patent examiner Protection: Pill   Other Topics Concern   Not on file   Social History Narrative     FAMILY HISTORY:  During the visit, a 4-generation pedigree was obtained. A copy of the pedigree with be scanned into Epic under the  Media tab. Significant family history diagnoses include the following: Family History  Problem Relation Age of Onset   Breast cancer Mother 58   Cancer Mother 6    breast cancer   Heart disease Father    Hypertension Father    Heart disease Paternal Grandmother    Hypertension Maternal Grandmother    Cancer Maternal Grandmother     jaw cancer at older age   98 Sister 5    cervical   Cancer Brother 76    NHL   Bonnie Hensley's ancestry is of [place] and [place] descent. There is no known Jewish ancestry or consanguinity.  GENETIC COUNSELING ASSESSMENT:  Bonnie Hensley is a 54 y.o. female with a family history of cancer suggestive of a hereditary predisposition to cancer. We, therefore, discussed and recommended the following at today's visit.   DISCUSSION:  We reviewed the characteristics, features and inheritance patterns of hereditary cancer syndromes. We also discussed genetic testing, including the appropriate family members to test, the process of testing, insurance coverage and turn-around-time for results. We discussed the implications of a negative, positive and/or variant of uncertain significant result. We recommended Bonnie Hensley pursue genetic testing for the BreastNext gene panel. The BreastNext gene panel offered by [facility] [employer] includes sequencing and rearrangement analysis for the following 17 genes: ATM, BARD1, BRCA1, BRCA2, BRIP1, CDH1, CHEK2, MRE11A, MUTYH,  NBN, NF1, PALB2, PTEN, RAD50, RAD51C, RAD51D, and TP53.  PLAN:  Based on our above recommendation, Bonnie Hensley wished to pursue genetic testing and the blood sample was drawn and will be sent to [facility] for analysis. Results should be available within approximately 4 weeks time, at which point they will be disclosed by telephone to Bonnie Hensley, as will any additional recommendations warranted by these results.   We also encouraged Bonnie Hensley to remain in contact with cancer genetics  annually so that we can continuously update the family history and inform her of any changes in cancer genetics and testing that may be of benefit for this family.   Ms.  Hensley questions were answered to her satisfaction today. Our contact information was provided should additional questions or concerns arise. Thank you for the referral and allowing [place] to share in the care of your patient.   Bonnie A. Fine, MS, CGC Certified Psychologist, sport [email or web address] phone: [phone]  The patient was seen for a total of 45 minutes in face-to-face genetic counseling.  This patient was discussed with Dr. Jana Hakim who agrees with the above.    ______________________________________________________________________ For Office Staff:  Number of people involved in session including genetic counselor: 2 Was an intern or student involved with case: not applicable

## 2015-03-05 ENCOUNTER — Encounter: Payer: Self-pay | Admitting: Genetic Counselor

## 2015-03-05 DIAGNOSIS — Z803 Family history of malignant neoplasm of breast: Secondary | ICD-10-CM

## 2015-03-05 NOTE — Progress Notes (Signed)
[facility] Genetic Test Results   REFERRING PROVIDER: Wyatt Haste, MD  PRIMARY PROVIDER:  Wyatt Haste, MD  GENETIC TEST RESULT:  Testing Laboratory: [facility]  Test Ordered: BreastNext gene panel Date of Report: 02/22/2015 Result: Normal, no pathogenic mutations identified General Interpretation: Reassuring  HPI: Ms. Lonsway was previously seen in the [facility] due to concerns regarding a hereditary predisposition to cancer. Please refer to our prior cancer genetics clinic note for more information regarding Ms. Sabater's medical, social and family histories, and our assessment and recommendations, at the time. Ms. Marin genetic test results and recommendations warranted by these results were recently disclosed to her and are discussed in more detail below.  GENETIC TEST RESULTS: At the time of Ms. Mudry's visit, we recommended she pursue genetic testing, which includes sequencing and deletion/duplication analysis of several genes associated with an increased risk for cancer via a gene panel. The BreastNext gene panel offered by [employer] includes sequencing and rearrangement analysis for the following 17 genes: ATM, BARD1, BRCA1, BRCA2, BRIP1, CDH1, CHEK2, MRE11A, MUTYH, NBN, NF1, PALB2, PTEN, RAD50, RAD51C, RAD51D, and TP53. Genetic testing for this gene panel was normal and did not reveal a pathogenic mutation in any of these genes. A copy of the genetic test report will be scanned into Epic under the media tab.   We discussed with Ms. Walla that current genetic testing is not perfect, and it is, therefore, possible there may be a pathogenic gene mutation in one of these genes that current testing cannot detect, but that chance is small.  We also discussed, that it is possible that another gene that has not yet been discovered, or that we have not yet tested, is responsible for the cancer  diagnoses in her family. It is, therefore, important for Ms. Vint to continue to remain in touch with cancer genetics so that we can continue to offer Ms. Vrooman the most up to date genetic testing.   CANCER SCREENING RECOMMENDATIONS: This normal result is reassuring and indicates that Ms. Eisler does not likely have an increased risk of cancer due to a mutation in one of these genes.  We, therefore, recommended  Ms. Lanni continue to follow the cancer screening guidelines provided by her primary healthcare providers.   RECOMMENDATIONS FOR FAMILY MEMBERS:  While these results are reassuring for Ms. Altieri, this test does not tell us anything about Ms. Stockhausen's siblings' or maternal relatives' risks. We recommended these relatives also have genetic counseling and testing and are happy to help facilitate testing and/or a referral if needed. Cancer genetic counselors can also be located, by visiting the website of the [employer] (Artist[employer]se) and Field seismologist for a Oncologist by zip code.    FOLLOW-UP: Lastly, we discussed with Ms. Cerami that cancer genetics is a rapidly advancing field and it is likely that new genetic tests will be appropriate for her and/or family members in the future. We encouraged her to remain in contact with cancer genetics on an annual basis so we can update her personal and family histories and let her know of advances in cancer genetics that may benefit this family.   Our contact number was provided. Ms. Bischoff questions were answered to her satisfaction, and she knows she is welcome to call us at anytime with additional questions or concerns.    Catherine A. Fine, MS, CGC Certified Psychologist, [email or web address] Phone: [phone]

## 2015-03-26 ENCOUNTER — Other Ambulatory Visit: Payer: BLUE CROSS/BLUE SHIELD

## 2015-03-27 ENCOUNTER — Other Ambulatory Visit: Payer: BLUE CROSS/BLUE SHIELD

## 2015-03-27 DIAGNOSIS — E038 Other specified hypothyroidism: Secondary | ICD-10-CM

## 2015-03-28 LAB — TSH: TSH: 1.469 u[IU]/mL (ref 0.350–4.500)

## 2015-04-09 ENCOUNTER — Encounter: Payer: Self-pay | Admitting: *Deleted

## 2015-05-02 ENCOUNTER — Other Ambulatory Visit: Payer: Self-pay | Admitting: Family Medicine

## 2015-08-10 ENCOUNTER — Other Ambulatory Visit: Payer: Self-pay | Admitting: Family Medicine

## 2015-11-14 ENCOUNTER — Other Ambulatory Visit: Payer: Self-pay | Admitting: Family Medicine

## 2015-12-14 ENCOUNTER — Encounter: Payer: Self-pay | Admitting: Family Medicine

## 2015-12-14 ENCOUNTER — Ambulatory Visit (INDEPENDENT_AMBULATORY_CARE_PROVIDER_SITE_OTHER): Payer: BLUE CROSS/BLUE SHIELD | Admitting: Family Medicine

## 2015-12-14 VITALS — BP 112/70 | HR 82 | Resp 16 | Ht 64.0 in | Wt 127.8 lb

## 2015-12-14 DIAGNOSIS — E039 Hypothyroidism, unspecified: Secondary | ICD-10-CM

## 2015-12-14 DIAGNOSIS — Z1159 Encounter for screening for other viral diseases: Secondary | ICD-10-CM | POA: Diagnosis not present

## 2015-12-14 DIAGNOSIS — D509 Iron deficiency anemia, unspecified: Secondary | ICD-10-CM | POA: Diagnosis not present

## 2015-12-14 DIAGNOSIS — E785 Hyperlipidemia, unspecified: Secondary | ICD-10-CM

## 2015-12-14 LAB — COMPREHENSIVE METABOLIC PANEL
ALK PHOS: 81 U/L (ref 33–130)
ALT: 20 U/L (ref 6–29)
AST: 22 U/L (ref 10–35)
Albumin: 4.8 g/dL (ref 3.6–5.1)
BILIRUBIN TOTAL: 0.4 mg/dL (ref 0.2–1.2)
BUN: 16 mg/dL (ref 7–25)
CALCIUM: 9.9 mg/dL (ref 8.6–10.4)
CO2: 27 mmol/L (ref 20–31)
Chloride: 103 mmol/L (ref 98–110)
Creat: 0.72 mg/dL (ref 0.50–1.05)
GLUCOSE: 91 mg/dL (ref 65–99)
Potassium: 4.3 mmol/L (ref 3.5–5.3)
Sodium: 140 mmol/L (ref 135–146)
Total Protein: 7.2 g/dL (ref 6.1–8.1)

## 2015-12-14 LAB — CBC WITH DIFFERENTIAL/PLATELET
Basophils Absolute: 0.1 10*3/uL (ref 0.0–0.1)
Basophils Relative: 1 % (ref 0–1)
EOS PCT: 5 % (ref 0–5)
Eosinophils Absolute: 0.4 10*3/uL (ref 0.0–0.7)
HEMATOCRIT: 41.6 % (ref 36.0–46.0)
HEMOGLOBIN: 13.8 g/dL (ref 12.0–15.0)
Lymphocytes Relative: 27 % (ref 12–46)
Lymphs Abs: 2.1 10*3/uL (ref 0.7–4.0)
MCH: 29.7 pg (ref 26.0–34.0)
MCHC: 33.2 g/dL (ref 30.0–36.0)
MCV: 89.5 fL (ref 78.0–100.0)
MONOS PCT: 10 % (ref 3–12)
MPV: 10 fL (ref 8.6–12.4)
Monocytes Absolute: 0.8 10*3/uL (ref 0.1–1.0)
NEUTROS ABS: 4.5 10*3/uL (ref 1.7–7.7)
Neutrophils Relative %: 57 % (ref 43–77)
Platelets: 326 10*3/uL (ref 150–400)
RBC: 4.65 MIL/uL (ref 3.87–5.11)
RDW: 13.4 % (ref 11.5–15.5)
WBC: 7.9 10*3/uL (ref 4.0–10.5)

## 2015-12-14 LAB — LIPID PANEL
CHOLESTEROL: 169 mg/dL (ref 125–200)
HDL: 90 mg/dL (ref >=46)
LDL Cholesterol: 71 mg/dL (ref <130)
TRIGLYCERIDES: 41 mg/dL (ref <150)
Total CHOL/HDL Ratio: 1.9 Ratio (ref <=5.0)
VLDL: 8 mg/dL (ref <30)

## 2015-12-14 LAB — TSH: TSH: 4 mIU/L

## 2015-12-14 MED ORDER — LEVOTHYROXINE SODIUM 75 MCG PO TABS: 75.0000 ug | ORAL_TABLET | Freq: Every day | ORAL | Status: DC

## 2015-12-14 MED ORDER — SIMVASTATIN 20 MG PO TABS: 20.0000 mg | ORAL_TABLET | Freq: Every day | ORAL | Status: DC

## 2015-12-14 NOTE — Progress Notes (Signed)
   Subjective:    Patient ID: Bonnie SkeensRebecca G Hensley, female    DOB: 1960/10/29, 55 y.o.   MRN: [id number]  HPI She is here for medication check appointment. She does see her gynecologist regularly and is now taking an OTC meds with black cohosh which is helping with her hot flash symptoms. She also has a previous history of iron deficiency and is taking iron supplementation. She continues on Synthroid and is having no difficulty with the medication. She has noted no skin or hair changes, hot or cold intolerance. She also takes simvastatin for treatment of her underlying hyperlipidemia and having no difficulty with this medication. No longer taking the norethindrone.In general life is treating her quite well.   Review of Systems     Objective:   Physical Exam Alert and in no distress otherwise not examined       Assessment & Plan:  Hyperlipidemia with target LDL less than 100 - Plan: CBC with Differential/Platelet, Comprehensive metabolic panel, Lipid panel, simvastatin (ZOCOR) 20 MG tablet  Iron deficiency anemia - Plan: CBC with Differential/Platelet, Comprehensive metabolic panel  Hypothyroidism, unspecified hypothyroidism type - Plan: CBC with Differential/Platelet, Comprehensive metabolic panel, TSH, levothyroxine (SYNTHROID, LEVOTHROID) 75 MCG tablet  Need for hepatitis C screening test - Plan: Hepatitis C antibody Overall she seems to be doing quite nicely having no real problems. I will recheck her blood work. Did recommend a good multivitamin. Also due to her age and will check for hepatitis C.

## 2015-12-15 LAB — HEPATITIS C ANTIBODY: HCV Ab: NEGATIVE

## 2015-12-21 DIAGNOSIS — Z124 Encounter for screening for malignant neoplasm of cervix: Secondary | ICD-10-CM | POA: Diagnosis not present

## 2015-12-21 DIAGNOSIS — Z6822 Body mass index (BMI) 22.0-22.9, adult: Secondary | ICD-10-CM | POA: Diagnosis not present

## 2015-12-21 DIAGNOSIS — Z01419 Encounter for gynecological examination (general) (routine) without abnormal findings: Secondary | ICD-10-CM | POA: Diagnosis not present

## 2015-12-21 DIAGNOSIS — Z1231 Encounter for screening mammogram for malignant neoplasm of breast: Secondary | ICD-10-CM | POA: Diagnosis not present

## 2016-12-26 DIAGNOSIS — L821 Other seborrheic keratosis: Secondary | ICD-10-CM | POA: Diagnosis not present

## 2016-12-26 DIAGNOSIS — D225 Melanocytic nevi of trunk: Secondary | ICD-10-CM | POA: Diagnosis not present

## 2016-12-26 DIAGNOSIS — D2261 Melanocytic nevi of right upper limb, including shoulder: Secondary | ICD-10-CM | POA: Diagnosis not present

## 2016-12-26 DIAGNOSIS — D2262 Melanocytic nevi of left upper limb, including shoulder: Secondary | ICD-10-CM | POA: Diagnosis not present

## 2017-01-01 DIAGNOSIS — H5213 Myopia, bilateral: Secondary | ICD-10-CM | POA: Diagnosis not present

## 2017-01-01 DIAGNOSIS — H524 Presbyopia: Secondary | ICD-10-CM | POA: Diagnosis not present

## 2017-01-15 ENCOUNTER — Other Ambulatory Visit: Payer: Self-pay | Admitting: Obstetrics and Gynecology

## 2017-01-15 DIAGNOSIS — Z6821 Body mass index (BMI) 21.0-21.9, adult: Secondary | ICD-10-CM | POA: Diagnosis not present

## 2017-01-15 DIAGNOSIS — E2839 Other primary ovarian failure: Secondary | ICD-10-CM

## 2017-01-15 DIAGNOSIS — Z1231 Encounter for screening mammogram for malignant neoplasm of breast: Secondary | ICD-10-CM | POA: Diagnosis not present

## 2017-01-15 DIAGNOSIS — Z124 Encounter for screening for malignant neoplasm of cervix: Secondary | ICD-10-CM | POA: Diagnosis not present

## 2017-01-15 DIAGNOSIS — Z01419 Encounter for gynecological examination (general) (routine) without abnormal findings: Secondary | ICD-10-CM | POA: Diagnosis not present

## 2017-01-19 LAB — CYTOLOGY - PAP

## 2017-02-04 ENCOUNTER — Ambulatory Visit
Admission: RE | Admit: 2017-02-04 | Discharge: 2017-02-04 | Disposition: A | Discharge status: Home / Self Care | Payer: BLUE CROSS/BLUE SHIELD | Source: Ambulatory Visit | Attending: Obstetrics and Gynecology | Admitting: Obstetrics and Gynecology

## 2017-02-04 DIAGNOSIS — M85851 Other specified disorders of bone density and structure, right thigh: Secondary | ICD-10-CM | POA: Diagnosis not present

## 2017-02-04 DIAGNOSIS — M8580 Other specified disorders of bone density and structure, unspecified site: Secondary | ICD-10-CM | POA: Insufficient documentation

## 2017-02-04 DIAGNOSIS — E2839 Other primary ovarian failure: Secondary | ICD-10-CM

## 2017-02-05 ENCOUNTER — Other Ambulatory Visit: Payer: Self-pay | Admitting: Family Medicine

## 2017-02-05 DIAGNOSIS — E785 Hyperlipidemia, unspecified: Secondary | ICD-10-CM

## 2017-02-05 DIAGNOSIS — E039 Hypothyroidism, unspecified: Secondary | ICD-10-CM

## 2017-02-05 NOTE — Telephone Encounter (Signed)
Pt has an appt June 13. Will ONLY give 30 days to get to appt

## 2017-02-25 ENCOUNTER — Encounter: Payer: Self-pay | Admitting: Family Medicine

## 2017-02-25 ENCOUNTER — Ambulatory Visit (INDEPENDENT_AMBULATORY_CARE_PROVIDER_SITE_OTHER): Payer: BLUE CROSS/BLUE SHIELD | Admitting: Family Medicine

## 2017-02-25 VITALS — BP 104/60 | HR 80 | Ht 64.0 in | Wt 127.0 lb

## 2017-02-25 DIAGNOSIS — Z803 Family history of malignant neoplasm of breast: Secondary | ICD-10-CM

## 2017-02-25 DIAGNOSIS — E785 Hyperlipidemia, unspecified: Secondary | ICD-10-CM

## 2017-02-25 DIAGNOSIS — D509 Iron deficiency anemia, unspecified: Secondary | ICD-10-CM

## 2017-02-25 DIAGNOSIS — K219 Gastro-esophageal reflux disease without esophagitis: Secondary | ICD-10-CM | POA: Diagnosis not present

## 2017-02-25 DIAGNOSIS — M8580 Other specified disorders of bone density and structure, unspecified site: Secondary | ICD-10-CM

## 2017-02-25 DIAGNOSIS — E039 Hypothyroidism, unspecified: Secondary | ICD-10-CM | POA: Diagnosis not present

## 2017-02-25 LAB — LIPID PANEL
CHOLESTEROL: 152 mg/dL (ref <200)
HDL: 81 mg/dL (ref >50)
LDL Cholesterol: 61 mg/dL (ref <100)
TRIGLYCERIDES: 50 mg/dL (ref <150)
Total CHOL/HDL Ratio: 1.9 Ratio (ref <5.0)
VLDL: 10 mg/dL (ref <30)

## 2017-02-25 LAB — COMPREHENSIVE METABOLIC PANEL
ALBUMIN: 4.6 g/dL (ref 3.6–5.1)
ALT: 18 U/L (ref 6–29)
AST: 21 U/L (ref 10–35)
Alkaline Phosphatase: 83 U/L (ref 33–130)
BILIRUBIN TOTAL: 0.4 mg/dL (ref 0.2–1.2)
BUN: 16 mg/dL (ref 7–25)
CALCIUM: 9.8 mg/dL (ref 8.6–10.4)
CHLORIDE: 104 mmol/L (ref 98–110)
CO2: 27 mmol/L (ref 20–31)
CREATININE: 0.68 mg/dL (ref 0.50–1.05)
Glucose, Bld: 94 mg/dL (ref 65–99)
Potassium: 4.2 mmol/L (ref 3.5–5.3)
SODIUM: 140 mmol/L (ref 135–146)
TOTAL PROTEIN: 7.2 g/dL (ref 6.1–8.1)

## 2017-02-25 LAB — CBC WITH DIFFERENTIAL/PLATELET
BASOS ABS: 87 {cells}/uL (ref 0–200)
Basophils Relative: 1 %
EOS PCT: 4 %
Eosinophils Absolute: 348 cells/uL (ref 15–500)
HEMATOCRIT: 41.4 % (ref 35.0–45.0)
HEMOGLOBIN: 13.6 g/dL (ref 11.7–15.5)
LYMPHS ABS: 2088 {cells}/uL (ref 850–3900)
Lymphocytes Relative: 24 %
MCH: 29.8 pg (ref 27.0–33.0)
MCHC: 32.9 g/dL (ref 32.0–36.0)
MCV: 90.6 fL (ref 80.0–100.0)
MONO ABS: 609 {cells}/uL (ref 200–950)
MPV: 10.2 fL (ref 7.5–12.5)
Monocytes Relative: 7 %
NEUTROS ABS: 5568 {cells}/uL (ref 1500–7800)
Neutrophils Relative %: 64 %
Platelets: 315 10*3/uL (ref 140–400)
RBC: 4.57 MIL/uL (ref 3.80–5.10)
RDW: 13.6 % (ref 11.0–15.0)
WBC: 8.7 10*3/uL (ref 4.0–10.5)

## 2017-02-25 LAB — TSH: TSH: 0.83 mIU/L

## 2017-02-25 NOTE — Progress Notes (Signed)
   Subjective:    Patient ID: Bonnie Hensley, female    DOB: [birth year], 56 y.o.   MRN: [id number]  HPI She is here for an interval evaluation. She does have a family history of breast cancer and has had BRCA testing which was negative. She gets regular mammograms. She continues on simvastatin and has had no aches or pains with that. She also continues on iron and does have a previous history of iron deficiency. She also is taking Synthroid and is taking this appropriately. Recently had a DEXA scan which did show osteopenia. Her reflux gets her very little difficulty. She does see gynecology regularly. Social and family history was reviewed as well as health maintenance and immunizations.   Review of Systems     Objective:   Physical Exam Alert and in no distress. Tympanic membranes and canals are normal. Pharyngeal area is normal. Neck is supple without adenopathy or thyromegaly. Cardiac exam shows a regular sinus rhythm without murmurs or gallops. Lungs are clear to auscultation.        Assessment & Plan:  Hyperlipidemia with target LDL less than 100 - Plan: Comprehensive metabolic panel, Lipid panel  Iron deficiency anemia, unspecified iron deficiency anemia type - Plan: CBC with Differential/Platelet, Comprehensive metabolic panel  Family history of malignant neoplasm of breast  Hypothyroidism, unspecified type - Plan: TSH  Osteopenia, unspecified location - Plan: CBC with Differential/Platelet, Comprehensive metabolic panel She seems to be stable on the above diagnoses. Did recommend she continue to take the Synthroid on an empty stomach and if she needs to take a PPI, take it at least an hour apart or if not then at bedtime. She will continue to follow-up with her mammograms. Will await blood studies before recommendation concerning the iron but since she is postmenopausal, she might not need that.

## 2017-02-26 MED ORDER — LEVOTHYROXINE SODIUM 75 MCG PO TABS: 75.0000 ug | ORAL_TABLET | Freq: Every day | ORAL | 3 refills | Status: DC

## 2017-02-26 MED ORDER — SIMVASTATIN 20 MG PO TABS: 20.0000 mg | ORAL_TABLET | Freq: Every day | ORAL | 3 refills | Status: DC

## 2017-02-26 NOTE — Addendum Note (Signed)
Addended by: Ronnald NianLALONDE, JOHN C on: 02/26/2017 12:55 PM   Modules accepted: Orders

## 2018-02-05 DIAGNOSIS — Z6822 Body mass index (BMI) 22.0-22.9, adult: Secondary | ICD-10-CM | POA: Diagnosis not present

## 2018-02-05 DIAGNOSIS — Z01419 Encounter for gynecological examination (general) (routine) without abnormal findings: Secondary | ICD-10-CM | POA: Diagnosis not present

## 2018-02-05 DIAGNOSIS — Z803 Family history of malignant neoplasm of breast: Secondary | ICD-10-CM | POA: Diagnosis not present

## 2018-02-05 DIAGNOSIS — Z8049 Family history of malignant neoplasm of other genital organs: Secondary | ICD-10-CM | POA: Diagnosis not present

## 2018-02-05 DIAGNOSIS — Z8041 Family history of malignant neoplasm of ovary: Secondary | ICD-10-CM | POA: Diagnosis not present

## 2018-02-16 DIAGNOSIS — Z1231 Encounter for screening mammogram for malignant neoplasm of breast: Secondary | ICD-10-CM | POA: Diagnosis not present

## 2018-02-16 DIAGNOSIS — Z6822 Body mass index (BMI) 22.0-22.9, adult: Secondary | ICD-10-CM | POA: Diagnosis not present

## 2018-02-22 ENCOUNTER — Other Ambulatory Visit: Payer: Self-pay | Admitting: Family Medicine

## 2018-02-22 DIAGNOSIS — E785 Hyperlipidemia, unspecified: Secondary | ICD-10-CM

## 2018-03-11 ENCOUNTER — Ambulatory Visit (INDEPENDENT_AMBULATORY_CARE_PROVIDER_SITE_OTHER): Payer: BLUE CROSS/BLUE SHIELD | Admitting: Family Medicine

## 2018-03-11 ENCOUNTER — Encounter: Payer: Self-pay | Admitting: Family Medicine

## 2018-03-11 VITALS — BP 110/62 | HR 85 | Temp 97.9°F | Wt 127.8 lb

## 2018-03-11 DIAGNOSIS — E785 Hyperlipidemia, unspecified: Secondary | ICD-10-CM | POA: Diagnosis not present

## 2018-03-11 DIAGNOSIS — E039 Hypothyroidism, unspecified: Secondary | ICD-10-CM

## 2018-03-11 DIAGNOSIS — Z803 Family history of malignant neoplasm of breast: Secondary | ICD-10-CM

## 2018-03-11 DIAGNOSIS — D509 Iron deficiency anemia, unspecified: Secondary | ICD-10-CM

## 2018-03-11 MED ORDER — LEVOTHYROXINE SODIUM 75 MCG PO TABS: 75.0000 ug | ORAL_TABLET | Freq: Every day | ORAL | 3 refills | Status: DC

## 2018-03-11 MED ORDER — SIMVASTATIN 20 MG PO TABS: 20.0000 mg | ORAL_TABLET | Freq: Every day | ORAL | 3 refills | Status: DC

## 2018-03-11 NOTE — Progress Notes (Signed)
   Subjective:    Patient ID: Bonnie SkeensRebecca G Olejniczak, female    DOB: 1961-01-21, 57 y.o.   MRN: [id number]  HPI She is here for medication management visit.  She does have underlying hypothyroidism and continues on her thyroid med without a problem.  She also is taking Zocor.  Her father apparently did have heart disease but it was in his late 5660s.  There is a family history for breast cancer.  She has had some genetic testing already done.  She also has a history of iron deficiency anemia.  Otherwise she is doing quite nicely.  She has no other concerns or complaints.  Her work and home life are going well.  Smoking and drinking was reviewed.  She does see her gynecologist regularly.   Review of Systems     Objective:   Physical Exam Alert and in no distress. Tympanic membranes and canals are normal. Pharyngeal area is normal. Neck is supple without adenopathy or thyromegaly. Cardiac exam shows a regular sinus rhythm without murmurs or gallops. Lungs are clear to auscultation.       Assessment & Plan:  Hyperlipidemia with target LDL less than 100 - Plan: Lipid panel, simvastatin (ZOCOR) 20 MG tablet  Family history of malignant neoplasm of breast - Plan: CBC with Differential/Platelet, Comprehensive metabolic panel  Hypothyroidism, unspecified type - Plan: TSH, levothyroxine (SYNTHROID, LEVOTHROID) 75 MCG tablet  Iron deficiency anemia, unspecified iron deficiency anemia type - Plan: CBC with Differential/Platelet I will renew her meds and I encouraged her to continue to take good care of herself.

## 2018-03-12 LAB — LIPID PANEL
CHOL/HDL RATIO: 2.2 ratio (ref 0.0–4.4)
Cholesterol, Total: 190 mg/dL (ref 100–199)
HDL: 87 mg/dL (ref >39)
LDL Calculated: 85 mg/dL (ref 0–99)
Triglycerides: 92 mg/dL (ref 0–149)
VLDL CHOLESTEROL CAL: 18 mg/dL (ref 5–40)

## 2018-03-12 LAB — CBC WITH DIFFERENTIAL/PLATELET
Basophils Absolute: 0.1 10*3/uL (ref 0.0–0.2)
Basos: 1 %
EOS (ABSOLUTE): 0.4 10*3/uL (ref 0.0–0.4)
EOS: 5 %
Hematocrit: 41.8 % (ref 34.0–46.6)
Hemoglobin: 13.9 g/dL (ref 11.1–15.9)
IMMATURE GRANULOCYTES: 0 %
Immature Grans (Abs): 0 10*3/uL (ref 0.0–0.1)
LYMPHS: 29 %
Lymphocytes Absolute: 2.2 10*3/uL (ref 0.7–3.1)
MCH: 30.2 pg (ref 26.6–33.0)
MCHC: 33.3 g/dL (ref 31.5–35.7)
MCV: 91 fL (ref 79–97)
MONOS ABS: 0.7 10*3/uL (ref 0.1–0.9)
Monocytes: 9 %
NEUTROS PCT: 56 %
Neutrophils Absolute: 4.4 10*3/uL (ref 1.4–7.0)
PLATELETS: 329 10*3/uL (ref 150–450)
RBC: 4.6 x10E6/uL (ref 3.77–5.28)
RDW: 13.5 % (ref 12.3–15.4)
WBC: 7.8 10*3/uL (ref 3.4–10.8)

## 2018-03-12 LAB — COMPREHENSIVE METABOLIC PANEL
ALT: 19 IU/L (ref 0–32)
AST: 23 IU/L (ref 0–40)
Albumin/Globulin Ratio: 1.8 (ref 1.2–2.2)
Albumin: 4.8 g/dL (ref 3.5–5.5)
Alkaline Phosphatase: 80 IU/L (ref 39–117)
BUN/Creatinine Ratio: 19 (ref 9–23)
BUN: 15 mg/dL (ref 6–24)
Bilirubin Total: 0.5 mg/dL (ref 0.0–1.2)
CALCIUM: 10.4 mg/dL — AB (ref 8.7–10.2)
CHLORIDE: 104 mmol/L (ref 96–106)
CO2: 24 mmol/L (ref 20–29)
Creatinine, Ser: 0.79 mg/dL (ref 0.57–1.00)
GFR calc Af Amer: 95 mL/min/{1.73_m2} (ref >59)
GFR, EST NON AFRICAN AMERICAN: 83 mL/min/{1.73_m2} (ref >59)
Globulin, Total: 2.6 g/dL (ref 1.5–4.5)
Glucose: 103 mg/dL — ABNORMAL HIGH (ref 65–99)
POTASSIUM: 5.1 mmol/L (ref 3.5–5.2)
Sodium: 143 mmol/L (ref 134–144)
Total Protein: 7.4 g/dL (ref 6.0–8.5)

## 2018-03-12 LAB — TSH: TSH: 1.51 u[IU]/mL (ref 0.450–4.500)

## 2018-04-06 DIAGNOSIS — Z6821 Body mass index (BMI) 21.0-21.9, adult: Secondary | ICD-10-CM | POA: Diagnosis not present

## 2018-04-06 DIAGNOSIS — Z7183 Encounter for nonprocreative genetic counseling: Secondary | ICD-10-CM | POA: Diagnosis not present

## 2018-06-25 ENCOUNTER — Telehealth: Payer: Self-pay

## 2018-06-25 NOTE — Telephone Encounter (Signed)
Pt called about her colonscospy and I have advised pt that we are working on closing gaps in care. Pt was advside to disregard the phone call. KH

## 2018-06-30 ENCOUNTER — Encounter: Payer: Self-pay | Admitting: Medical

## 2018-06-30 ENCOUNTER — Ambulatory Visit (INDEPENDENT_AMBULATORY_CARE_PROVIDER_SITE_OTHER): Payer: BLUE CROSS/BLUE SHIELD | Admitting: Medical

## 2018-06-30 VITALS — BP 120/70 | HR 86 | Temp 98.0°F | Resp 16 | Ht 64.0 in | Wt 128.8 lb

## 2018-06-30 DIAGNOSIS — J988 Other specified respiratory disorders: Secondary | ICD-10-CM | POA: Diagnosis not present

## 2018-06-30 NOTE — Progress Notes (Signed)
Subjective:  Bonnie Hensley is a 57 y.o. female who presents for respiratory illness.  A week ago started feeling some fatigue, by the end of the week last week started getting cold symptoms.  So for the last 4 to 5 days she has had cough, fatigue, some sinus pressure, feeling of like a weight on her chest, some nausea, some loose stool, some body aches.  Gradual onset.  Denies fever, no vomiting, no sore throat, no ear pain, no wheezing, no shortness of breath.  Denies history of asthma, non-smoker.  No sick contacts.  Currently not using any medications over-the-counter for symptoms. No other aggravating or relieving factors.  No other c/o.  Past Medical History:  Diagnosis Date  . Dyslipidemia   . HH (hiatus hernia)   . Hypothyroidism   . Reflux     ROS as in subjective   Objective: BP 120/70   Pulse 86   Temp 98 F (36.7 C) (Oral)   Resp 16   Ht 5\' 4"  (1.626 m)   Wt 128 lb 12.8 oz (58.4 kg)   SpO2 98%   BMI 22.11 kg/m   General appearance: Alert, WD/WN, no distress, mildly ill appearing                             Skin: warm, no rash                           Head: no sinus tenderness                            Eyes: conjunctiva normal, corneas clear, PERRLA                            Ears: pearly TMs, external ear canals normal                          Nose: septum midline, turbinates swollen, with erythema and clear discharge             Mouth/throat: MMM, tongue normal, mild pharyngeal erythema                           Neck: supple, no adenopathy, no thyromegaly, non tender                          Heart: RRR, normal S1, S2, no murmurs                         Lungs: CTA bilaterally, no wheezes, rales, or rhonchi      Assessment  Encounter Diagnosis  Name Primary?  Marland Kitchen Respiratory tract infection Yes      Plan: Symptoms and exam suggest viral respiratory tract infection that is likely improving.  We discussed supportive care, usual timeframe to see soon.   Advised to use either DayQuil and NyQuil, over-the-counter Mucinex DM.  Continue to hydrate well, rest, and I would expect her symptoms to gradually improve over the next 4 to 5 days although her cough can linger.  She can call back if worse or not improving in the next 48 hours  Patient was advised to call or return if worse or not improving  in the next few days.    Patient voiced understanding of diagnosis, recommendations, and treatment plan.  After visit summary given.   Eathel was seen today for sinus infection.  Diagnoses and all orders for this visit:  Respiratory tract infection

## 2018-08-06 DIAGNOSIS — D2262 Melanocytic nevi of left upper limb, including shoulder: Secondary | ICD-10-CM | POA: Diagnosis not present

## 2018-08-06 DIAGNOSIS — D2272 Melanocytic nevi of left lower limb, including hip: Secondary | ICD-10-CM | POA: Diagnosis not present

## 2019-03-01 ENCOUNTER — Ambulatory Visit (INDEPENDENT_AMBULATORY_CARE_PROVIDER_SITE_OTHER): Payer: BC Managed Care – PPO | Admitting: Family Medicine

## 2019-03-01 ENCOUNTER — Encounter: Payer: Self-pay | Admitting: Family Medicine

## 2019-03-01 ENCOUNTER — Other Ambulatory Visit: Payer: Self-pay

## 2019-03-01 VITALS — Temp 97.7°F | Wt 120.0 lb

## 2019-03-01 DIAGNOSIS — J019 Acute sinusitis, unspecified: Secondary | ICD-10-CM | POA: Diagnosis not present

## 2019-03-01 MED ORDER — AMOXICILLIN 875 MG PO TABS: 875.0000 mg | ORAL_TABLET | Freq: Two times a day (BID) | ORAL | 0 refills | Status: DC

## 2019-03-01 NOTE — Progress Notes (Signed)
   Subjective:    Patient ID: Bonnie Hensley, female    DOB: [birth year], 58 y.o.   MRN: [id number]  HPI Documentation for virtual telephone encounter. Documentation for virtual audio and video telecommunications through [place] encounter: The patient was located at home. The provider was located in the office. The patient did consent to this visit and is aware of possible charges through their insurance for this visit. The other persons participating in this telemedicine service were none. Time spent on call was 5 minutes and in review of previous records >15 minutes total. This virtual service is not related to other E/M service within previous 7 days. She had flulike symptoms in February that essentially went away except for continued difficulty with intermittent nasal congestion, PND, rhinorrhea.  No fever, chills, earache, cough or congestion. She has had some difficulty with loose stools and excessive gas.    Review of Systems     Objective:   Physical Exam Alert and in no distress otherwise not examined       Assessment & Plan:   Encounter Diagnosis  Name Primary?  . Acute non-recurrent sinusitis, unspecified location Yes  Her symptoms are consistent with sinusitis.  I will call in Amoxil.  She is to call me if not entirely better when she finishes the antibiotic.  Cautioned about this causing worsening of her diarrhea.  Recommend probiotic.  Also discussed possible use of Gas-X to help with excessive flatulence.

## 2019-03-08 DIAGNOSIS — Z6821 Body mass index (BMI) 21.0-21.9, adult: Secondary | ICD-10-CM | POA: Diagnosis not present

## 2019-03-08 DIAGNOSIS — Z01419 Encounter for gynecological examination (general) (routine) without abnormal findings: Secondary | ICD-10-CM | POA: Diagnosis not present

## 2019-03-08 DIAGNOSIS — Z1231 Encounter for screening mammogram for malignant neoplasm of breast: Secondary | ICD-10-CM | POA: Diagnosis not present

## 2019-03-08 LAB — HM MAMMOGRAPHY

## 2019-03-11 ENCOUNTER — Encounter: Payer: Self-pay | Admitting: Internal Medicine

## 2019-03-14 ENCOUNTER — Other Ambulatory Visit: Payer: Self-pay | Admitting: Obstetrics and Gynecology

## 2019-03-14 DIAGNOSIS — Z803 Family history of malignant neoplasm of breast: Secondary | ICD-10-CM

## 2019-03-28 ENCOUNTER — Ambulatory Visit (INDEPENDENT_AMBULATORY_CARE_PROVIDER_SITE_OTHER): Payer: BC Managed Care – PPO | Admitting: Family Medicine

## 2019-03-28 ENCOUNTER — Encounter: Payer: Self-pay | Admitting: Family Medicine

## 2019-03-28 ENCOUNTER — Other Ambulatory Visit: Payer: Self-pay

## 2019-03-28 ENCOUNTER — Other Ambulatory Visit: Payer: Self-pay | Admitting: Family Medicine

## 2019-03-28 VITALS — BP 110/72 | HR 80 | Temp 97.3°F | Wt 123.0 lb

## 2019-03-28 DIAGNOSIS — E039 Hypothyroidism, unspecified: Secondary | ICD-10-CM | POA: Diagnosis not present

## 2019-03-28 DIAGNOSIS — Z803 Family history of malignant neoplasm of breast: Secondary | ICD-10-CM

## 2019-03-28 DIAGNOSIS — E785 Hyperlipidemia, unspecified: Secondary | ICD-10-CM

## 2019-03-28 DIAGNOSIS — D509 Iron deficiency anemia, unspecified: Secondary | ICD-10-CM

## 2019-03-28 DIAGNOSIS — K219 Gastro-esophageal reflux disease without esophagitis: Secondary | ICD-10-CM

## 2019-03-28 DIAGNOSIS — Z0000 Encounter for general adult medical examination without abnormal findings: Secondary | ICD-10-CM | POA: Diagnosis not present

## 2019-03-28 DIAGNOSIS — M8580 Other specified disorders of bone density and structure, unspecified site: Secondary | ICD-10-CM | POA: Diagnosis not present

## 2019-03-28 MED ORDER — LEVOTHYROXINE SODIUM 75 MCG PO TABS: 75.0000 ug | ORAL_TABLET | Freq: Every day | ORAL | 3 refills | Status: DC

## 2019-03-28 MED ORDER — SIMVASTATIN 20 MG PO TABS: 20.0000 mg | ORAL_TABLET | Freq: Every day | ORAL | 3 refills | Status: DC

## 2019-03-28 NOTE — Progress Notes (Signed)
   Subjective:    Patient ID: Bonnie Hensley, female    DOB: May 26, 1961, 58 y.o.   MRN: [id number]  HPI She is here for complete examination.  She states that her sinusitis is now cleared up but she is still having some difficulty with hoarse voice and clearing her throat a lot.  She does have a previous history of reflux disease and has used Prilosec in the past.  She also continues on her thyroid medication and is having no difficulty with that.  She does see her gynecologist regularly.  There is a family history of breast cancer and she does have a mammogram scheduled in the near future.  She is also had some difficulty with anemia in the past.  Continues on simvastatin without difficulty.  Also has a history of osteopenia and has been taking a vitamin D supplement.  She sees her gynecologist regularly.  She is also taking other OTC preparations.  Otherwise she is doing well.  Her home life and work are going well. Family and social history as well as health maintenance immunizations was reviewed.   Review of Systems  All other systems reviewed and are negative.      Objective:   Physical Exam Alert and in no distress. Tympanic membranes and canals are normal. Pharyngeal area is normal. Neck is supple without adenopathy or thyromegaly. Cardiac exam shows a regular sinus rhythm without murmurs or gallops. Lungs are clear to auscultation.  Abdominal exam shows no masses or tenderness.        Assessment & Plan:  Routine general medical examination at a health care facility - Plan: CBC with Differential/Platelet, Comprehensive metabolic panel, Lipid panel,   Osteopenia, unspecified location - Plan: DG Bone Density, VITAMIN D 25 Hydroxy (Vit-D Deficiency, Fractures),   Hypothyroidism, unspecified type - Plan: TSH, levothyroxine (SYNTHROID) 75 MCG tablet,   Hyperlipidemia with target LDL less than 100 - Plan: Lipid panel, simvastatin (ZOCOR) 20 MG tablet,   Family history of malignant  neoplasm of breast - Plan: Mammogram as scheduled  Gastroesophageal reflux disease without esophagitis - Plan: Recommend she try Prilosec on a regular basis to see if this will help with her hoarse voice and PND type symptoms.  Iron deficiency anemia, unspecified iron deficiency anemia type - Plan: CBC with Differential/Platelet,

## 2019-03-29 LAB — TSH: TSH: 6.04 u[IU]/mL — ABNORMAL HIGH (ref 0.450–4.500)

## 2019-03-29 LAB — CBC WITH DIFFERENTIAL/PLATELET
Basophils Absolute: 0.1 10*3/uL (ref 0.0–0.2)
Basos: 1 %
EOS (ABSOLUTE): 0.3 10*3/uL (ref 0.0–0.4)
Eos: 3 %
Hematocrit: 42.6 % (ref 34.0–46.6)
Hemoglobin: 14 g/dL (ref 11.1–15.9)
Immature Grans (Abs): 0 10*3/uL (ref 0.0–0.1)
Immature Granulocytes: 0 %
Lymphocytes Absolute: 2.7 10*3/uL (ref 0.7–3.1)
Lymphs: 30 %
MCH: 29.6 pg (ref 26.6–33.0)
MCHC: 32.9 g/dL (ref 31.5–35.7)
MCV: 90 fL (ref 79–97)
Monocytes Absolute: 0.6 10*3/uL (ref 0.1–0.9)
Monocytes: 7 %
Neutrophils Absolute: 5.3 10*3/uL (ref 1.4–7.0)
Neutrophils: 59 %
Platelets: 318 10*3/uL (ref 150–450)
RBC: 4.73 x10E6/uL (ref 3.77–5.28)
RDW: 12.6 % (ref 11.7–15.4)
WBC: 9 10*3/uL (ref 3.4–10.8)

## 2019-03-29 LAB — COMPREHENSIVE METABOLIC PANEL
ALT: 25 IU/L (ref 0–32)
AST: 29 IU/L (ref 0–40)
Albumin/Globulin Ratio: 2 (ref 1.2–2.2)
Albumin: 5.1 g/dL — ABNORMAL HIGH (ref 3.8–4.9)
Alkaline Phosphatase: 84 IU/L (ref 39–117)
BUN/Creatinine Ratio: 13 (ref 9–23)
BUN: 11 mg/dL (ref 6–24)
Bilirubin Total: 0.3 mg/dL (ref 0.0–1.2)
CO2: 24 mmol/L (ref 20–29)
Calcium: 10.2 mg/dL (ref 8.7–10.2)
Chloride: 102 mmol/L (ref 96–106)
Creatinine, Ser: 0.85 mg/dL (ref 0.57–1.00)
GFR calc Af Amer: 87 mL/min/{1.73_m2} (ref >59)
GFR calc non Af Amer: 75 mL/min/{1.73_m2} (ref >59)
Globulin, Total: 2.6 g/dL (ref 1.5–4.5)
Glucose: 90 mg/dL (ref 65–99)
Potassium: 4.4 mmol/L (ref 3.5–5.2)
Sodium: 141 mmol/L (ref 134–144)
Total Protein: 7.7 g/dL (ref 6.0–8.5)

## 2019-03-29 LAB — VITAMIN D 25 HYDROXY (VIT D DEFICIENCY, FRACTURES): Vit D, 25-Hydroxy: 51.7 ng/mL (ref 30.0–100.0)

## 2019-03-29 LAB — LIPID PANEL
Chol/HDL Ratio: 1.9 ratio (ref 0.0–4.4)
Cholesterol, Total: 184 mg/dL (ref 100–199)
HDL: 98 mg/dL (ref >39)
LDL Calculated: 75 mg/dL (ref 0–99)
Triglycerides: 53 mg/dL (ref 0–149)
VLDL Cholesterol Cal: 11 mg/dL (ref 5–40)

## 2019-04-08 ENCOUNTER — Other Ambulatory Visit: Payer: Self-pay | Admitting: Obstetrics and Gynecology

## 2019-04-12 ENCOUNTER — Other Ambulatory Visit: Payer: BLUE CROSS/BLUE SHIELD

## 2019-05-25 ENCOUNTER — Telehealth: Payer: Self-pay | Admitting: Internal Medicine

## 2019-05-25 DIAGNOSIS — E785 Hyperlipidemia, unspecified: Secondary | ICD-10-CM

## 2019-05-25 DIAGNOSIS — D509 Iron deficiency anemia, unspecified: Secondary | ICD-10-CM

## 2019-05-25 DIAGNOSIS — E039 Hypothyroidism, unspecified: Secondary | ICD-10-CM

## 2019-05-25 DIAGNOSIS — Z0000 Encounter for general adult medical examination without abnormal findings: Secondary | ICD-10-CM

## 2019-05-25 NOTE — Telephone Encounter (Signed)
Pt is scheduled for labs coming  Up but no future orders. Please put future orders in

## 2019-05-26 DIAGNOSIS — L245 Irritant contact dermatitis due to other chemical products: Secondary | ICD-10-CM | POA: Diagnosis not present

## 2019-05-26 NOTE — Addendum Note (Signed)
Addended by: Denita Lung on: 05/26/2019 12:52 PM   Modules accepted: Orders

## 2019-05-26 NOTE — Telephone Encounter (Signed)
Done

## 2019-05-30 ENCOUNTER — Other Ambulatory Visit: Payer: Self-pay

## 2019-05-30 ENCOUNTER — Other Ambulatory Visit: Payer: BC Managed Care – PPO

## 2019-05-30 DIAGNOSIS — E039 Hypothyroidism, unspecified: Secondary | ICD-10-CM

## 2019-05-30 DIAGNOSIS — Z0000 Encounter for general adult medical examination without abnormal findings: Secondary | ICD-10-CM

## 2019-05-31 LAB — TSH: TSH: 2.92 u[IU]/mL (ref 0.450–4.500)

## 2019-10-20 DIAGNOSIS — D225 Melanocytic nevi of trunk: Secondary | ICD-10-CM | POA: Diagnosis not present

## 2019-10-20 DIAGNOSIS — L814 Other melanin hyperpigmentation: Secondary | ICD-10-CM | POA: Diagnosis not present

## 2019-10-20 DIAGNOSIS — D2262 Melanocytic nevi of left upper limb, including shoulder: Secondary | ICD-10-CM | POA: Diagnosis not present

## 2019-10-20 DIAGNOSIS — L570 Actinic keratosis: Secondary | ICD-10-CM | POA: Diagnosis not present

## 2019-10-20 DIAGNOSIS — L918 Other hypertrophic disorders of the skin: Secondary | ICD-10-CM | POA: Diagnosis not present

## 2020-02-15 ENCOUNTER — Telehealth: Payer: Self-pay | Admitting: Family Medicine

## 2020-02-15 NOTE — Telephone Encounter (Signed)
Pt called and wants a referral to get a colonoscopy done. She also wants to see if she is due to have her shingles shot

## 2020-02-15 NOTE — Telephone Encounter (Signed)
Done KH 

## 2020-02-15 NOTE — Telephone Encounter (Signed)
Set her up from the shingles shot.  It looks like she is due for a colonoscopy next year rather than this year

## 2020-02-17 ENCOUNTER — Ambulatory Visit (INDEPENDENT_AMBULATORY_CARE_PROVIDER_SITE_OTHER): Payer: BC Managed Care – PPO | Admitting: Family Medicine

## 2020-02-17 ENCOUNTER — Encounter: Payer: Self-pay | Admitting: Family Medicine

## 2020-02-17 ENCOUNTER — Other Ambulatory Visit: Payer: Self-pay

## 2020-02-17 VITALS — BP 108/74 | HR 106 | Temp 98.0°F | Wt 127.8 lb

## 2020-02-17 DIAGNOSIS — E039 Hypothyroidism, unspecified: Secondary | ICD-10-CM | POA: Diagnosis not present

## 2020-02-17 DIAGNOSIS — R194 Change in bowel habit: Secondary | ICD-10-CM | POA: Diagnosis not present

## 2020-02-17 DIAGNOSIS — E785 Hyperlipidemia, unspecified: Secondary | ICD-10-CM | POA: Diagnosis not present

## 2020-02-17 NOTE — Progress Notes (Signed)
° °  Subjective:    Patient ID: Bonnie Hensley, female    DOB: 06-Nov-1960, 59 y.o.   MRN: [id number]  HPI She is here for evaluation of change in bowel habits.  She states that roughly 6 months ago she noted the stool was thinner than normal.  Her normal pattern is to have 1 BM per day.  Over the last 6 months she has noted a BM then followed by looser BM having 2 or 3 of those potentially in 1 day.  She has not noted any blood or black tarry stool.  Her weight is unchanged.  Her eating habits have been unchanged.  No family history of colon cancer.  She did have a colonoscopy in 2012 done in [place]. She does have a history of hypothyroid as well as hyperlipidemia.  She is scheduled for complete exam in the next couple of months.   Review of Systems     Objective:   Physical Exam Alert and in no distress.  Abdominal exam shows no masses or tenderness with normal bowel sounds. Blood work from July was reviewed.       Assessment & Plan:  Change in bowel habit - Plan: Ambulatory referral to Gastroenterology, CBC with Differential/Platelet, Comprehensive metabolic panel  Hypothyroidism, unspecified type - Plan: TSH  Hyperlipidemia with target LDL less than 100 - Plan: Lipid panel Decided to check the thyroid and lipids since she was having blood drawn in lieu of doing this again in a couple of months.  She was comfortable with that. I discussed the change in bowel habits is concerning and since she is 9 years away from her previous colonoscopy I will refer her for further evaluation of the change in bowel habits.

## 2020-02-18 LAB — CBC WITH DIFFERENTIAL/PLATELET
Basophils Absolute: 0.1 10*3/uL (ref 0.0–0.2)
Basos: 1 %
EOS (ABSOLUTE): 0.4 10*3/uL (ref 0.0–0.4)
Eos: 5 %
Hematocrit: 40.3 % (ref 34.0–46.6)
Hemoglobin: 13.6 g/dL (ref 11.1–15.9)
Immature Grans (Abs): 0 10*3/uL (ref 0.0–0.1)
Immature Granulocytes: 0 %
Lymphocytes Absolute: 2.1 10*3/uL (ref 0.7–3.1)
Lymphs: 25 %
MCH: 29.7 pg (ref 26.6–33.0)
MCHC: 33.7 g/dL (ref 31.5–35.7)
MCV: 88 fL (ref 79–97)
Monocytes Absolute: 0.7 10*3/uL (ref 0.1–0.9)
Monocytes: 8 %
Neutrophils Absolute: 5.1 10*3/uL (ref 1.4–7.0)
Neutrophils: 61 %
Platelets: 293 10*3/uL (ref 150–450)
RBC: 4.58 x10E6/uL (ref 3.77–5.28)
RDW: 13 % (ref 11.7–15.4)
WBC: 8.4 10*3/uL (ref 3.4–10.8)

## 2020-02-18 LAB — TSH: TSH: 4.97 u[IU]/mL — ABNORMAL HIGH (ref 0.450–4.500)

## 2020-02-18 LAB — COMPREHENSIVE METABOLIC PANEL
ALT: 19 IU/L (ref 0–32)
AST: 24 IU/L (ref 0–40)
Albumin/Globulin Ratio: 1.9 (ref 1.2–2.2)
Albumin: 4.7 g/dL (ref 3.8–4.9)
Alkaline Phosphatase: 83 IU/L (ref 48–121)
BUN/Creatinine Ratio: 25 (ref 12–28)
BUN: 20 mg/dL (ref 8–27)
Bilirubin Total: 0.4 mg/dL (ref 0.0–1.2)
CO2: 22 mmol/L (ref 20–29)
Calcium: 10.2 mg/dL (ref 8.7–10.3)
Chloride: 104 mmol/L (ref 96–106)
Creatinine, Ser: 0.8 mg/dL (ref 0.57–1.00)
GFR calc Af Amer: 93 mL/min/{1.73_m2} (ref >59)
GFR calc non Af Amer: 80 mL/min/{1.73_m2} (ref >59)
Globulin, Total: 2.5 g/dL (ref 1.5–4.5)
Glucose: 96 mg/dL (ref 65–99)
Potassium: 4.3 mmol/L (ref 3.5–5.2)
Sodium: 142 mmol/L (ref 134–144)
Total Protein: 7.2 g/dL (ref 6.0–8.5)

## 2020-02-18 LAB — LIPID PANEL
Chol/HDL Ratio: 2.1 ratio (ref 0.0–4.4)
Cholesterol, Total: 186 mg/dL (ref 100–199)
HDL: 90 mg/dL (ref >39)
LDL Chol Calc (NIH): 84 mg/dL (ref 0–99)
Triglycerides: 64 mg/dL (ref 0–149)
VLDL Cholesterol Cal: 12 mg/dL (ref 5–40)

## 2020-04-05 DIAGNOSIS — Z6822 Body mass index (BMI) 22.0-22.9, adult: Secondary | ICD-10-CM | POA: Diagnosis not present

## 2020-04-05 DIAGNOSIS — Z1231 Encounter for screening mammogram for malignant neoplasm of breast: Secondary | ICD-10-CM | POA: Diagnosis not present

## 2020-04-05 DIAGNOSIS — Z124 Encounter for screening for malignant neoplasm of cervix: Secondary | ICD-10-CM | POA: Diagnosis not present

## 2020-04-05 DIAGNOSIS — Z01419 Encounter for gynecological examination (general) (routine) without abnormal findings: Secondary | ICD-10-CM | POA: Diagnosis not present

## 2020-04-05 LAB — HM MAMMOGRAPHY

## 2020-04-05 LAB — RESULTS CONSOLE HPV: CHL HPV: NEGATIVE

## 2020-04-10 ENCOUNTER — Encounter: Payer: Self-pay | Admitting: Family Medicine

## 2020-04-10 ENCOUNTER — Ambulatory Visit (INDEPENDENT_AMBULATORY_CARE_PROVIDER_SITE_OTHER): Payer: BC Managed Care – PPO | Admitting: Family Medicine

## 2020-04-10 ENCOUNTER — Other Ambulatory Visit: Payer: Self-pay

## 2020-04-10 VITALS — BP 102/70 | HR 89 | Temp 98.0°F | Ht 62.75 in | Wt 128.8 lb

## 2020-04-10 DIAGNOSIS — E039 Hypothyroidism, unspecified: Secondary | ICD-10-CM | POA: Diagnosis not present

## 2020-04-10 DIAGNOSIS — E785 Hyperlipidemia, unspecified: Secondary | ICD-10-CM | POA: Diagnosis not present

## 2020-04-10 DIAGNOSIS — Z23 Encounter for immunization: Secondary | ICD-10-CM | POA: Diagnosis not present

## 2020-04-10 DIAGNOSIS — E2839 Other primary ovarian failure: Secondary | ICD-10-CM

## 2020-04-10 DIAGNOSIS — R194 Change in bowel habit: Secondary | ICD-10-CM | POA: Diagnosis not present

## 2020-04-10 NOTE — Progress Notes (Signed)
° °  Subjective:    Patient ID: URI COVEY, female    DOB: [birth year], 59 y.o.   MRN: [id number]  HPI She is here for a med check appointment.  She did have a change in bowel habits however there was a problem getting her scheduled to see GI.  Apparently tried to reach her but could not.  Also recent TSH was slightly elevated.  Review of the record indicates she does need to follow-up mammogram as well as bone density testing.  She also has a history of hyperlipidemia but presently is not on any medications.   Review of Systems     Objective:   Physical Exam Alert and in no distress. Tympanic membranes and canals are normal. Pharyngeal area is normal. Neck is supple without adenopathy or thyromegaly. Cardiac exam shows a regular sinus rhythm without murmurs or gallops. Lungs are clear to auscultation.        Assessment & Plan:  Change in bowel habit  Need for shingles vaccine - Plan: Varicella-zoster vaccine IM  Hypothyroidism, unspecified type  Hyperlipidemia with target LDL less than 100  Estrogen deficiency - Plan: DG Bone Density She was given the phone number for Dr. Haywood Pao office.  She will set up a follow-up exam for colonoscopy. I then discussed the thyroid medication and make sure that she is taking it on an empty stomach with no foods of any kind.  We can recheck this at a later date as her TSH was slightly elevated.  Over 30 minutes spent discussing these issues with her

## 2020-04-11 DIAGNOSIS — L821 Other seborrheic keratosis: Secondary | ICD-10-CM | POA: Diagnosis not present

## 2020-04-11 DIAGNOSIS — D225 Melanocytic nevi of trunk: Secondary | ICD-10-CM | POA: Diagnosis not present

## 2020-04-11 DIAGNOSIS — I788 Other diseases of capillaries: Secondary | ICD-10-CM | POA: Diagnosis not present

## 2020-04-13 ENCOUNTER — Other Ambulatory Visit: Payer: Self-pay | Admitting: Family Medicine

## 2020-04-13 DIAGNOSIS — E2839 Other primary ovarian failure: Secondary | ICD-10-CM

## 2020-04-17 ENCOUNTER — Telehealth: Payer: Self-pay | Admitting: Family Medicine

## 2020-04-17 NOTE — Telephone Encounter (Signed)
Received requested records from [facility] 

## 2020-05-04 ENCOUNTER — Encounter: Payer: Self-pay | Admitting: Family Medicine

## 2020-05-23 DIAGNOSIS — R194 Change in bowel habit: Secondary | ICD-10-CM | POA: Diagnosis not present

## 2020-05-23 DIAGNOSIS — Z1211 Encounter for screening for malignant neoplasm of colon: Secondary | ICD-10-CM | POA: Diagnosis not present

## 2020-05-23 DIAGNOSIS — K625 Hemorrhage of anus and rectum: Secondary | ICD-10-CM | POA: Diagnosis not present

## 2020-06-11 ENCOUNTER — Other Ambulatory Visit: Payer: Self-pay | Admitting: Family Medicine

## 2020-06-11 DIAGNOSIS — E039 Hypothyroidism, unspecified: Secondary | ICD-10-CM

## 2020-06-11 DIAGNOSIS — E785 Hyperlipidemia, unspecified: Secondary | ICD-10-CM

## 2020-06-12 ENCOUNTER — Ambulatory Visit (INDEPENDENT_AMBULATORY_CARE_PROVIDER_SITE_OTHER): Payer: BC Managed Care – PPO | Admitting: Family Medicine

## 2020-06-12 ENCOUNTER — Other Ambulatory Visit: Payer: Self-pay

## 2020-06-12 ENCOUNTER — Encounter: Payer: Self-pay | Admitting: Family Medicine

## 2020-06-12 VITALS — BP 120/66 | HR 92 | Temp 98.1°F | Wt 130.2 lb

## 2020-06-12 DIAGNOSIS — Z23 Encounter for immunization: Secondary | ICD-10-CM

## 2020-06-12 DIAGNOSIS — E039 Hypothyroidism, unspecified: Secondary | ICD-10-CM | POA: Diagnosis not present

## 2020-06-12 NOTE — Progress Notes (Signed)
   Subjective:    Patient ID: Bonnie Hensley, female    DOB: 04-Apr-1961, 59 y.o.   MRN: [id number]  HPI She is here for recheck.  Her previous TSH was slightly abnormal.  She has been taking it on an empty stomach and has not changed to a different brand of thyroid medication.  He is having no cold intolerance, skin or hair changes.  She also needs the shingles vaccine and would also like the flu shot.   Review of Systems     Objective:   Physical Exam Alert and in no distress otherwise not examined       Assessment & Plan:  Need for shingles vaccine - Plan: Varicella-zoster vaccine IM (Shingrix)  Hypothyroidism, unspecified type - Plan: TSH  Need for influenza vaccination - Plan: Flu Vaccine QUAD 6+ mos PF IM (Fluarix Quad PF)

## 2020-06-13 LAB — TSH: TSH: 4.41 u[IU]/mL (ref 0.450–4.500)

## 2020-07-11 ENCOUNTER — Other Ambulatory Visit: Payer: Self-pay

## 2020-07-11 ENCOUNTER — Ambulatory Visit
Admission: RE | Admit: 2020-07-11 | Discharge: 2020-07-11 | Disposition: A | Discharge status: Home / Self Care | Payer: BC Managed Care – PPO | Source: Ambulatory Visit | Attending: Family Medicine | Admitting: Family Medicine

## 2020-07-11 DIAGNOSIS — M8589 Other specified disorders of bone density and structure, multiple sites: Secondary | ICD-10-CM | POA: Diagnosis not present

## 2020-07-11 DIAGNOSIS — Z780 Asymptomatic menopausal state: Secondary | ICD-10-CM | POA: Diagnosis not present

## 2020-08-02 DIAGNOSIS — Z1211 Encounter for screening for malignant neoplasm of colon: Secondary | ICD-10-CM | POA: Diagnosis not present

## 2020-08-02 LAB — HM COLONOSCOPY

## 2020-08-16 ENCOUNTER — Encounter: Payer: Self-pay | Admitting: Family Medicine

## 2021-01-09 DIAGNOSIS — L819 Disorder of pigmentation, unspecified: Secondary | ICD-10-CM | POA: Diagnosis not present

## 2021-01-09 DIAGNOSIS — D2271 Melanocytic nevi of right lower limb, including hip: Secondary | ICD-10-CM | POA: Diagnosis not present

## 2021-01-09 DIAGNOSIS — D225 Melanocytic nevi of trunk: Secondary | ICD-10-CM | POA: Diagnosis not present

## 2021-01-09 DIAGNOSIS — D2261 Melanocytic nevi of right upper limb, including shoulder: Secondary | ICD-10-CM | POA: Diagnosis not present

## 2021-01-09 DIAGNOSIS — L570 Actinic keratosis: Secondary | ICD-10-CM | POA: Diagnosis not present

## 2021-04-29 ENCOUNTER — Telehealth: Payer: Self-pay | Admitting: Family Medicine

## 2021-04-29 ENCOUNTER — Other Ambulatory Visit: Payer: Self-pay

## 2021-04-29 DIAGNOSIS — E785 Hyperlipidemia, unspecified: Secondary | ICD-10-CM

## 2021-04-29 MED ORDER — SIMVASTATIN 20 MG PO TABS: 20.0000 mg | ORAL_TABLET | Freq: Every day | ORAL | 0 refills | Status: DC

## 2021-04-29 NOTE — Telephone Encounter (Signed)
Done KH 

## 2021-04-29 NOTE — Telephone Encounter (Signed)
Pt called and is out of town and forgot her medicine simvastatin harris tetter  [employer], [address], [place], [place] [address]

## 2021-05-21 ENCOUNTER — Encounter: Payer: Self-pay | Admitting: Family Medicine

## 2021-05-21 ENCOUNTER — Other Ambulatory Visit: Payer: Self-pay

## 2021-05-21 ENCOUNTER — Ambulatory Visit (INDEPENDENT_AMBULATORY_CARE_PROVIDER_SITE_OTHER): Payer: BC Managed Care – PPO | Admitting: Family Medicine

## 2021-05-21 VITALS — BP 120/68 | HR 100 | Temp 97.3°F | Wt 129.6 lb

## 2021-05-21 DIAGNOSIS — R202 Paresthesia of skin: Secondary | ICD-10-CM

## 2021-05-21 DIAGNOSIS — E039 Hypothyroidism, unspecified: Secondary | ICD-10-CM

## 2021-05-21 DIAGNOSIS — Z79899 Other long term (current) drug therapy: Secondary | ICD-10-CM

## 2021-05-21 NOTE — Progress Notes (Signed)
   Subjective:    Patient ID: Bonnie Hensley, female    DOB: 12/25/1960, 60 y.o.   MRN: [id number]  HPI She states that over the last 4 weeks she has noted a tingling sensation that started in her hands and now is in her hands and her feet.  No weakness, numbness or change in strength.  She continues on simvastatin, Synthroid and multivitamins.  She has no nausea, vomiting, headache blurred or double vision.  No symptoms in any other part of her body.  Review of Systems     Objective:   Physical Exam Alert and in no distress.  Normal strength and sensation.  Pulses normal.  DTRs normal.  No skin changes.  No clonus.       Assessment & Plan:  Hypothyroidism, unspecified type - Plan: TSH  Tingling in extremities - Plan: CBC with Differential/Platelet, Comprehensive metabolic panel, Lipid panel, Iron, TIBC and Ferritin Panel, Magnesium, Vitamin B12  Medication management - Plan: VITAMIN D 25 Hydroxy (Vit-D Deficiency, Fractures) I explained at the present time there is no good reason as to why she is handling this.  I will get some basic blood work on her and see if that shows anything.  She is also to pay attention to eating.  New symptoms that she might get.

## 2021-05-22 LAB — CBC WITH DIFFERENTIAL/PLATELET
Basophils Absolute: 0.1 10*3/uL (ref 0.0–0.2)
Basos: 1 %
EOS (ABSOLUTE): 0.2 10*3/uL (ref 0.0–0.4)
Eos: 3 %
Hematocrit: 42.8 % (ref 34.0–46.6)
Hemoglobin: 14 g/dL (ref 11.1–15.9)
Immature Grans (Abs): 0 10*3/uL (ref 0.0–0.1)
Immature Granulocytes: 0 %
Lymphocytes Absolute: 2.2 10*3/uL (ref 0.7–3.1)
Lymphs: 24 %
MCH: 29.4 pg (ref 26.6–33.0)
MCHC: 32.7 g/dL (ref 31.5–35.7)
MCV: 90 fL (ref 79–97)
Monocytes Absolute: 0.5 10*3/uL (ref 0.1–0.9)
Monocytes: 6 %
Neutrophils Absolute: 6.1 10*3/uL (ref 1.4–7.0)
Neutrophils: 66 %
Platelets: 331 10*3/uL (ref 150–450)
RBC: 4.77 x10E6/uL (ref 3.77–5.28)
RDW: 12.5 % (ref 11.7–15.4)
WBC: 9.1 10*3/uL (ref 3.4–10.8)

## 2021-05-22 LAB — IRON,TIBC AND FERRITIN PANEL
Ferritin: 67 ng/mL (ref 15–150)
Iron Saturation: 18 % (ref 15–55)
Iron: 59 ug/dL (ref 27–139)
Total Iron Binding Capacity: 324 ug/dL (ref 250–450)
UIBC: 265 ug/dL (ref 118–369)

## 2021-05-22 LAB — COMPREHENSIVE METABOLIC PANEL
ALT: 20 IU/L (ref 0–32)
AST: 27 IU/L (ref 0–40)
Albumin/Globulin Ratio: 2.1 (ref 1.2–2.2)
Albumin: 5.1 g/dL — ABNORMAL HIGH (ref 3.8–4.8)
Alkaline Phosphatase: 97 IU/L (ref 44–121)
BUN/Creatinine Ratio: 19 (ref 12–28)
BUN: 16 mg/dL (ref 8–27)
Bilirubin Total: 0.3 mg/dL (ref 0.0–1.2)
CO2: 25 mmol/L (ref 20–29)
Calcium: 10.2 mg/dL (ref 8.7–10.3)
Chloride: 100 mmol/L (ref 96–106)
Creatinine, Ser: 0.84 mg/dL (ref 0.57–1.00)
Globulin, Total: 2.4 g/dL (ref 1.5–4.5)
Glucose: 123 mg/dL — ABNORMAL HIGH (ref 65–99)
Potassium: 4.2 mmol/L (ref 3.5–5.2)
Sodium: 139 mmol/L (ref 134–144)
Total Protein: 7.5 g/dL (ref 6.0–8.5)
eGFR: 79 mL/min/{1.73_m2} (ref >59)

## 2021-05-22 LAB — TSH: TSH: 4.84 u[IU]/mL — ABNORMAL HIGH (ref 0.450–4.500)

## 2021-05-22 LAB — MAGNESIUM: Magnesium: 2.2 mg/dL (ref 1.6–2.3)

## 2021-05-22 LAB — LIPID PANEL
Chol/HDL Ratio: 2.2 ratio (ref 0.0–4.4)
Cholesterol, Total: 191 mg/dL (ref 100–199)
HDL: 86 mg/dL (ref >39)
LDL Chol Calc (NIH): 83 mg/dL (ref 0–99)
Triglycerides: 127 mg/dL (ref 0–149)
VLDL Cholesterol Cal: 22 mg/dL (ref 5–40)

## 2021-05-22 LAB — VITAMIN B12: Vitamin B-12: 680 pg/mL (ref 232–1245)

## 2021-05-22 LAB — VITAMIN D 25 HYDROXY (VIT D DEFICIENCY, FRACTURES): Vit D, 25-Hydroxy: 76.2 ng/mL (ref 30.0–100.0)

## 2021-06-05 ENCOUNTER — Telehealth: Payer: Self-pay | Admitting: Family Medicine

## 2021-06-05 NOTE — Telephone Encounter (Signed)
Pt chart was undated. KH

## 2021-06-05 NOTE — Telephone Encounter (Signed)
Pt called to advise she had her flu shot at work today. Pt states she was given no paperwork with any info.

## 2021-06-20 ENCOUNTER — Other Ambulatory Visit: Payer: Self-pay | Admitting: Family Medicine

## 2021-06-20 DIAGNOSIS — E039 Hypothyroidism, unspecified: Secondary | ICD-10-CM

## 2021-06-20 DIAGNOSIS — E785 Hyperlipidemia, unspecified: Secondary | ICD-10-CM

## 2021-07-09 DIAGNOSIS — Z1231 Encounter for screening mammogram for malignant neoplasm of breast: Secondary | ICD-10-CM | POA: Diagnosis not present

## 2021-07-09 DIAGNOSIS — Z01419 Encounter for gynecological examination (general) (routine) without abnormal findings: Secondary | ICD-10-CM | POA: Diagnosis not present

## 2021-07-12 ENCOUNTER — Other Ambulatory Visit: Payer: Self-pay | Admitting: Obstetrics and Gynecology

## 2021-07-12 DIAGNOSIS — R928 Other abnormal and inconclusive findings on diagnostic imaging of breast: Secondary | ICD-10-CM

## 2021-07-17 ENCOUNTER — Other Ambulatory Visit: Payer: Self-pay | Admitting: Obstetrics and Gynecology

## 2021-07-17 DIAGNOSIS — E2839 Other primary ovarian failure: Secondary | ICD-10-CM

## 2021-07-20 ENCOUNTER — Ambulatory Visit: Payer: Self-pay

## 2021-07-20 ENCOUNTER — Ambulatory Visit
Admission: RE | Admit: 2021-07-20 | Discharge: 2021-07-20 | Disposition: A | Discharge status: Home / Self Care | Payer: BC Managed Care – PPO | Source: Ambulatory Visit | Attending: Obstetrics and Gynecology | Admitting: Obstetrics and Gynecology

## 2021-07-20 DIAGNOSIS — R928 Other abnormal and inconclusive findings on diagnostic imaging of breast: Secondary | ICD-10-CM

## 2021-07-20 DIAGNOSIS — R922 Inconclusive mammogram: Secondary | ICD-10-CM | POA: Diagnosis not present

## 2021-08-12 ENCOUNTER — Other Ambulatory Visit: Payer: BC Managed Care – PPO

## 2021-12-21 ENCOUNTER — Other Ambulatory Visit: Payer: Self-pay | Admitting: Family Medicine

## 2021-12-21 DIAGNOSIS — E785 Hyperlipidemia, unspecified: Secondary | ICD-10-CM

## 2021-12-27 ENCOUNTER — Other Ambulatory Visit: Payer: BC Managed Care – PPO

## 2021-12-27 ENCOUNTER — Other Ambulatory Visit: Payer: Self-pay | Admitting: Obstetrics and Gynecology

## 2021-12-27 DIAGNOSIS — E2839 Other primary ovarian failure: Secondary | ICD-10-CM

## 2022-01-30 ENCOUNTER — Encounter: Payer: Self-pay | Admitting: Family Medicine

## 2022-01-30 ENCOUNTER — Ambulatory Visit (INDEPENDENT_AMBULATORY_CARE_PROVIDER_SITE_OTHER): Payer: BC Managed Care – PPO | Admitting: Family Medicine

## 2022-01-30 VITALS — BP 110/70 | HR 80 | Temp 97.2°F | Ht 63.0 in | Wt 132.2 lb

## 2022-01-30 DIAGNOSIS — E039 Hypothyroidism, unspecified: Secondary | ICD-10-CM

## 2022-01-30 DIAGNOSIS — R140 Abdominal distension (gaseous): Secondary | ICD-10-CM

## 2022-01-30 DIAGNOSIS — E785 Hyperlipidemia, unspecified: Secondary | ICD-10-CM

## 2022-01-30 DIAGNOSIS — Z1321 Encounter for screening for nutritional disorder: Secondary | ICD-10-CM

## 2022-01-30 DIAGNOSIS — K219 Gastro-esophageal reflux disease without esophagitis: Secondary | ICD-10-CM | POA: Diagnosis not present

## 2022-01-30 DIAGNOSIS — Z0000 Encounter for general adult medical examination without abnormal findings: Secondary | ICD-10-CM | POA: Diagnosis not present

## 2022-01-30 DIAGNOSIS — M8580 Other specified disorders of bone density and structure, unspecified site: Secondary | ICD-10-CM | POA: Diagnosis not present

## 2022-01-30 DIAGNOSIS — Z79899 Other long term (current) drug therapy: Secondary | ICD-10-CM

## 2022-01-30 DIAGNOSIS — Z803 Family history of malignant neoplasm of breast: Secondary | ICD-10-CM

## 2022-01-30 MED ORDER — SIMVASTATIN 20 MG PO TABS: 20.0000 mg | ORAL_TABLET | Freq: Every day | ORAL | 3 refills | Status: DC

## 2022-01-30 MED ORDER — LEVOTHYROXINE SODIUM 75 MCG PO TABS: 75.0000 ug | ORAL_TABLET | Freq: Every day | ORAL | 3 refills | Status: DC

## 2022-01-30 NOTE — Progress Notes (Signed)
Complete physical exam  Patient: Bonnie Hensley   DOB: [birth year]   61 y.o. Female  MRN: [id number]  Subjective:    Chief Complaint  Patient presents with   Annual Exam    Fasting     Bonnie Hensley is a 61 y.o. female who presents today for a complete physical exam. She reports consuming a general and low fat diet. Gym/ health club routine includes light weights and low impact aerobics. She generally feels well. She reports sleeping poorly. She notes difficulty with abdominal bloating but cannot relate this to dairy products or greasy foods.  She does note some irregular bowel habits as well but has had a colonoscopy which was negative.  She continues on simvastatin and having no difficulty with that.  She is also on thyroid medication as well as multivitamins.  She does have a history of osteopenia and is taking calcium and vitamin D supplementation.   Most recent fall risk assessment:    02/25/2017    8:06 AM  Fall Risk   Falls in the past year? No     Most recent depression screenings:    01/30/2022   10:27 AM 05/21/2021    1:33 PM  PHQ 2/9 Scores  PHQ - 2 Score 0 0      Patient Active Problem List   Diagnosis Date Noted   Osteopenia 02/04/2017   Family history of malignant neoplasm of breast 02/01/2015   Hyperlipidemia with target LDL less than 100 07/30/2011   Hypothyroid 07/30/2011   Gastroesophageal reflux disease without esophagitis 07/30/2011   Anemia 07/30/2011   Past Medical History:  Diagnosis Date   Dyslipidemia    HH (hiatus hernia)    Hypothyroidism    Reflux    Past Surgical History:  Procedure Laterality Date   Egg Donation     Social History   Tobacco Use   Smoking status: Never   Smokeless tobacco: Never  Substance Use Topics   Alcohol use: Yes    Alcohol/week: 2.0 standard drinks    Types: 2 Standard drinks or equivalent per week   Drug use: No   Family History  Problem Relation Age of Onset   Breast cancer Mother 75   Cancer  Mother 25       breast cancer   Heart disease Father    Hypertension Father    Heart disease Paternal Grandmother    Hypertension Maternal Grandmother    Cancer Maternal Grandmother        jaw cancer at older age   67 Sister 38       cervical   Cancer Brother 84       NHL   No Known Allergies    Patient Care Team: Denita Lung, MD as PCP - General   Outpatient Medications Prior to Visit  Medication Sig   Calcium Carbonate-Vitamin D 600-400 MG-UNIT chew tablet    co-enzyme Q-10 30 MG capsule Take 30 mg by mouth 3 (three) times daily.   levothyroxine (SYNTHROID) 75 MCG tablet TAKE ONE TABLET BY MOUTH DAILY   Multiple Vitamin (MULTIVITAMIN) tablet Take 1 tablet by mouth daily.   simvastatin (ZOCOR) 20 MG tablet TAKE ONE TABLET BY MOUTH DAILY   No facility-administered medications prior to visit.    Review of Systems  All other systems reviewed and are negative.        Objective:     BP 110/70   Pulse 80   Temp (!) 97.2 F (36.2  C)   Ht '5\' 3"'  (1.6 m)   Wt 132 lb 3.2 oz (60 kg)   LMP 10/10/2011   SpO2 96%   BMI 23.42 kg/m  BP Readings from Last 3 Encounters:  01/30/22 110/70  05/21/21 120/68  06/12/20 120/66      Physical Exam  Alert and in no distress. Tympanic membranes and canals are normal. Pharyngeal area is normal. Neck is supple without adenopathy or thyromegaly. Cardiac exam shows a regular sinus rhythm without murmurs or gallops. Lungs are clear to auscultation.   Last CBC Lab Results  Component Value Date   WBC 9.1 05/21/2021   HGB 14.0 05/21/2021   HCT 42.8 05/21/2021   MCV 90 05/21/2021   MCH 29.4 05/21/2021   RDW 12.5 05/21/2021   PLT 331 62/83/1517   Last metabolic panel Lab Results  Component Value Date   GLUCOSE 123 (H) 05/21/2021   NA 139 05/21/2021   K 4.2 05/21/2021   CL 100 05/21/2021   CO2 25 05/21/2021   BUN 16 05/21/2021   CREATININE 0.84 05/21/2021   EGFR 79 05/21/2021   CALCIUM 10.2 05/21/2021   PROT 7.5  05/21/2021   ALBUMIN 5.1 (H) 05/21/2021   LABGLOB 2.4 05/21/2021   AGRATIO 2.1 05/21/2021   BILITOT 0.3 05/21/2021   ALKPHOS 97 05/21/2021   AST 27 05/21/2021   ALT 20 05/21/2021   Last lipids Lab Results  Component Value Date   CHOL 191 05/21/2021   HDL 86 05/21/2021   LDLCALC 83 05/21/2021   TRIG 127 05/21/2021   CHOLHDL 2.2 05/21/2021   Last hemoglobin A1c No results found for: HGBA1C Last vitamin D Lab Results  Component Value Date   VD25OH 76.2 05/21/2021        Assessment & Plan:    Routine Health Maintenance and Physical Exam  Immunization History  Administered Date(s) Administered   DTaP 08/15/1959, 10/03/1959, 11/01/1959, 05/14/1960   DTaP / IPV 01/18/2015   IPV 11/01/1959, 05/14/1960, 03/20/1965, 06/25/1970   Influenza Split 07/30/2011   Influenza,inj,Quad PF,6+ Mos 07/05/2014, 06/12/2020   Influenza-Unspecified 06/15/2014, 06/05/2021   Moderna Covid-19 Vaccine Bivalent Booster 90yr & up 07/12/2021   Moderna Sars-Covid-2 Vaccination 11/18/2019, 12/23/2019, 07/30/2020, 02/12/2021   Smallpox 11/29/1959   Td 01/29/2004   Tdap 01/18/2015   Typhoid Inactivated 03/05/1960, 04/02/1960, 04/10/1960   Typhoid Parenteral 03/26/1965   Zoster Recombinat (Shingrix) 04/10/2020, 06/12/2020   Zoster, Live 10/18/2013    Health Maintenance  Topic Date Due   HIV Screening  05/21/2022 (Originally 05/02/1974)   MAMMOGRAM  04/05/2022   INFLUENZA VACCINE  04/15/2022   TETANUS/TDAP  01/17/2025   PAP SMEAR-Modifier  04/05/2025   COLONOSCOPY (Pts 45-418yrInsurance coverage will need to be confirmed)  08/02/2030   COVID-19 Vaccine  Completed   Hepatitis C Screening  Completed   Zoster Vaccines- Shingrix  Completed   HPV VACCINES  Aged Out    Discussed health benefits of physical activity, and encouraged her to engage in regular exercise appropriate for her age and condition.  Problem List Items Addressed This Visit     Family history of malignant neoplasm of breast    Gastroesophageal reflux disease without esophagitis   Hyperlipidemia with target LDL less than 100   Relevant Orders   Lipid panel   Hypothyroid   Relevant Orders   TSH   Osteopenia   Other Visit Diagnoses     Routine general medical examination at a health care facility    -  Primary   Relevant  Orders   CBC with Differential/Platelet   Comprehensive metabolic panel   Lipid panel   Medication management       Bloating         She will continue to get mammograms on a regular basis.  She is scheduled for repeat DEXA scan in October.  She will continue on calcium and vitamin D as well as exercise.  Recommend she try Gas-X or Prilosec to see if that will help with the bloating and if continued difficulty, reevaluate for possible referral to GI. Return in about 1 year (around 01/31/2023) for fasting cpe .     Jill Alexanders, MD

## 2022-01-30 NOTE — Patient Instructions (Signed)
Try Gas-X or even try something like Prilosec to see if that will help

## 2022-01-31 LAB — COMPREHENSIVE METABOLIC PANEL
ALT: 20 IU/L (ref 0–32)
AST: 24 IU/L (ref 0–40)
Albumin/Globulin Ratio: 1.6 (ref 1.2–2.2)
Albumin: 4.6 g/dL (ref 3.8–4.8)
Alkaline Phosphatase: 94 IU/L (ref 44–121)
BUN/Creatinine Ratio: 22 (ref 12–28)
BUN: 15 mg/dL (ref 8–27)
Bilirubin Total: 0.4 mg/dL (ref 0.0–1.2)
CO2: 25 mmol/L (ref 20–29)
Calcium: 10 mg/dL (ref 8.7–10.3)
Chloride: 101 mmol/L (ref 96–106)
Creatinine, Ser: 0.68 mg/dL (ref 0.57–1.00)
Globulin, Total: 2.8 g/dL (ref 1.5–4.5)
Glucose: 88 mg/dL (ref 70–99)
Potassium: 4.7 mmol/L (ref 3.5–5.2)
Sodium: 140 mmol/L (ref 134–144)
Total Protein: 7.4 g/dL (ref 6.0–8.5)
eGFR: 98 mL/min/{1.73_m2} (ref >59)

## 2022-01-31 LAB — LIPID PANEL
Chol/HDL Ratio: 2 ratio (ref 0.0–4.4)
Cholesterol, Total: 163 mg/dL (ref 100–199)
HDL: 82 mg/dL (ref >39)
LDL Chol Calc (NIH): 71 mg/dL (ref 0–99)
Triglycerides: 47 mg/dL (ref 0–149)
VLDL Cholesterol Cal: 10 mg/dL (ref 5–40)

## 2022-01-31 LAB — CBC WITH DIFFERENTIAL/PLATELET
Basophils Absolute: 0.1 10*3/uL (ref 0.0–0.2)
Basos: 1 %
EOS (ABSOLUTE): 0.4 10*3/uL (ref 0.0–0.4)
Eos: 5 %
Hematocrit: 40.4 % (ref 34.0–46.6)
Hemoglobin: 13.5 g/dL (ref 11.1–15.9)
Immature Grans (Abs): 0 10*3/uL (ref 0.0–0.1)
Immature Granulocytes: 0 %
Lymphocytes Absolute: 2.4 10*3/uL (ref 0.7–3.1)
Lymphs: 29 %
MCH: 30.1 pg (ref 26.6–33.0)
MCHC: 33.4 g/dL (ref 31.5–35.7)
MCV: 90 fL (ref 79–97)
Monocytes Absolute: 0.6 10*3/uL (ref 0.1–0.9)
Monocytes: 7 %
Neutrophils Absolute: 4.9 10*3/uL (ref 1.4–7.0)
Neutrophils: 58 %
Platelets: 299 10*3/uL (ref 150–450)
RBC: 4.48 x10E6/uL (ref 3.77–5.28)
RDW: 13 % (ref 11.7–15.4)
WBC: 8.5 10*3/uL (ref 3.4–10.8)

## 2022-01-31 LAB — TSH: TSH: 3.98 u[IU]/mL (ref 0.450–4.500)

## 2022-02-01 LAB — VITAMIN D 25 HYDROXY (VIT D DEFICIENCY, FRACTURES): Vit D, 25-Hydroxy: 63.8 ng/mL (ref 30.0–100.0)

## 2022-02-01 LAB — SPECIMEN STATUS REPORT

## 2022-05-21 ENCOUNTER — Encounter: Payer: Self-pay | Admitting: Internal Medicine

## 2022-06-24 ENCOUNTER — Encounter: Payer: Self-pay | Admitting: Internal Medicine

## 2022-07-07 ENCOUNTER — Encounter: Payer: Self-pay | Admitting: Internal Medicine

## 2022-07-18 ENCOUNTER — Ambulatory Visit
Admission: RE | Admit: 2022-07-18 | Discharge: 2022-07-18 | Disposition: A | Discharge status: Home / Self Care | Payer: BC Managed Care – PPO | Source: Ambulatory Visit | Attending: Obstetrics and Gynecology | Admitting: Obstetrics and Gynecology

## 2022-07-18 DIAGNOSIS — Z780 Asymptomatic menopausal state: Secondary | ICD-10-CM | POA: Diagnosis not present

## 2022-07-18 DIAGNOSIS — M85851 Other specified disorders of bone density and structure, right thigh: Secondary | ICD-10-CM | POA: Diagnosis not present

## 2022-07-18 DIAGNOSIS — E2839 Other primary ovarian failure: Secondary | ICD-10-CM

## 2022-07-25 DIAGNOSIS — Z6822 Body mass index (BMI) 22.0-22.9, adult: Secondary | ICD-10-CM | POA: Diagnosis not present

## 2022-07-25 DIAGNOSIS — Z1231 Encounter for screening mammogram for malignant neoplasm of breast: Secondary | ICD-10-CM | POA: Diagnosis not present

## 2022-07-25 DIAGNOSIS — Z01419 Encounter for gynecological examination (general) (routine) without abnormal findings: Secondary | ICD-10-CM | POA: Diagnosis not present

## 2022-08-17 IMAGING — MG MM DIGITAL DIAGNOSTIC UNILAT*L* W/ TOMO W/ CAD
6 series · 6 of 18 positions shown · non-contrast
Comparison: Previous exams including recent screening mammogram
dated 07/09/2021.

CLINICAL DATA: Patient returns today to evaluate a possible LEFT
breast asymmetry questioned on recent screening mammogram.

EXAM:
DIGITAL DIAGNOSTIC UNILATERAL LEFT MAMMOGRAM WITH TOMOSYNTHESIS AND
CAD
TECHNIQUE: Left digital diagnostic mammography and breast tomosynthesis was
performed. The images were evaluated with computer-aided detection.

[L MLO synth-2D (1 of 2)]
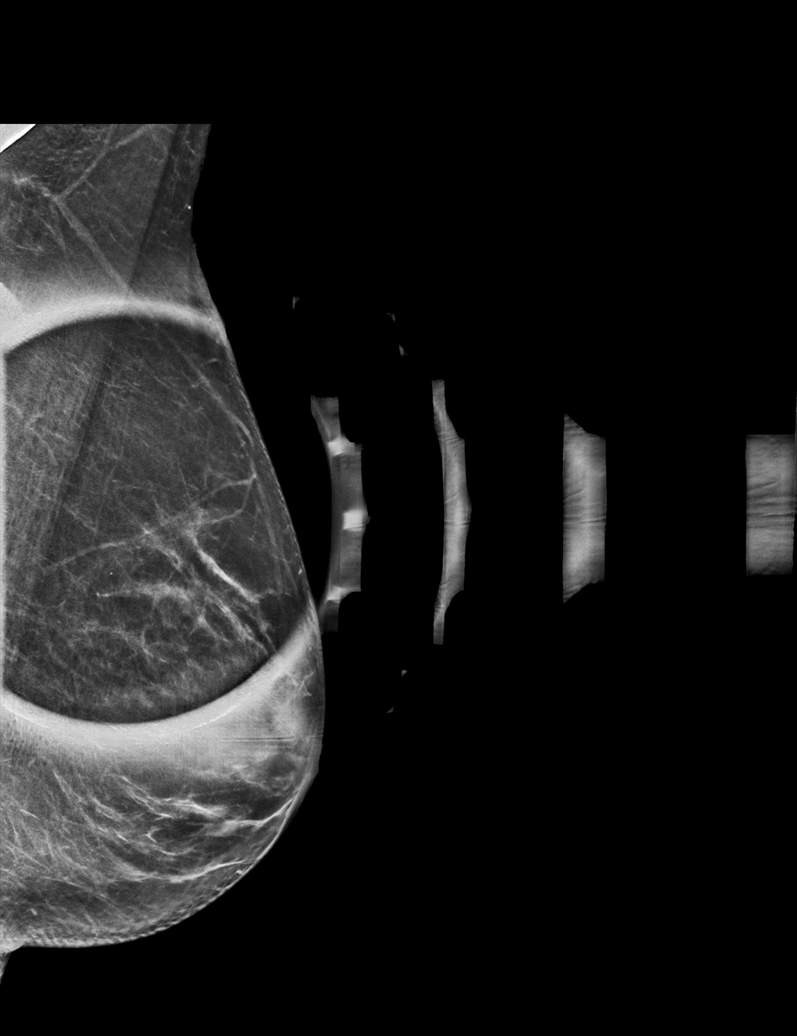

[L ML synth-2D]
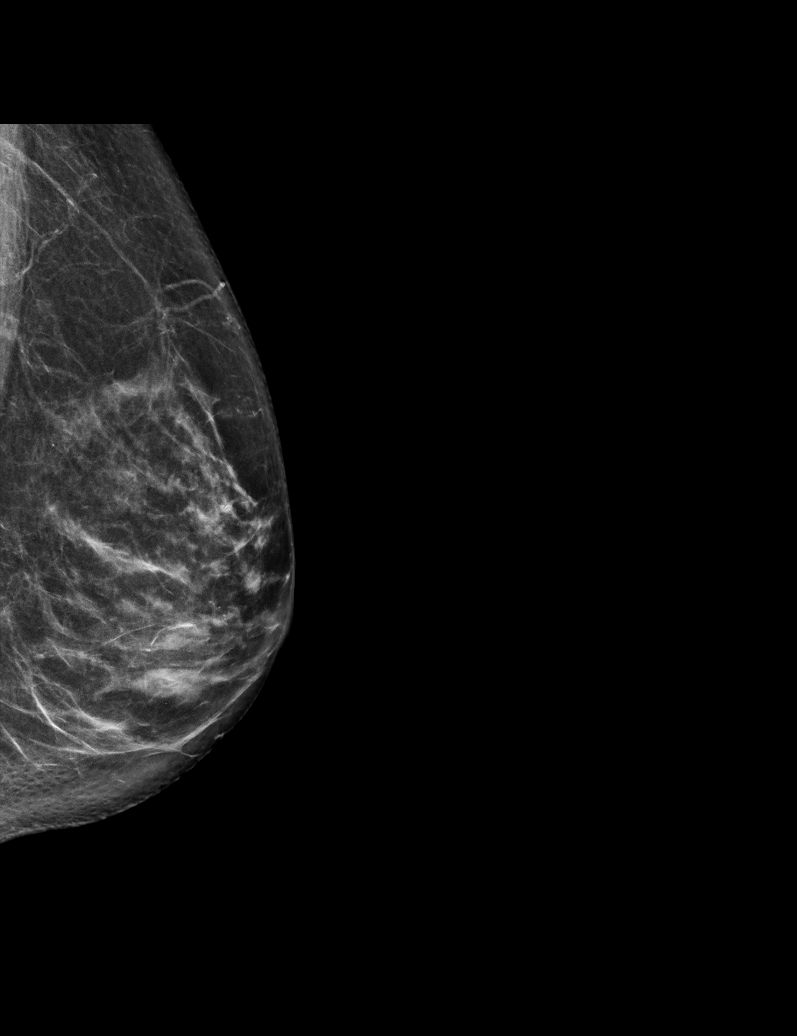

[L MLO synth-2D (2 of 2)]
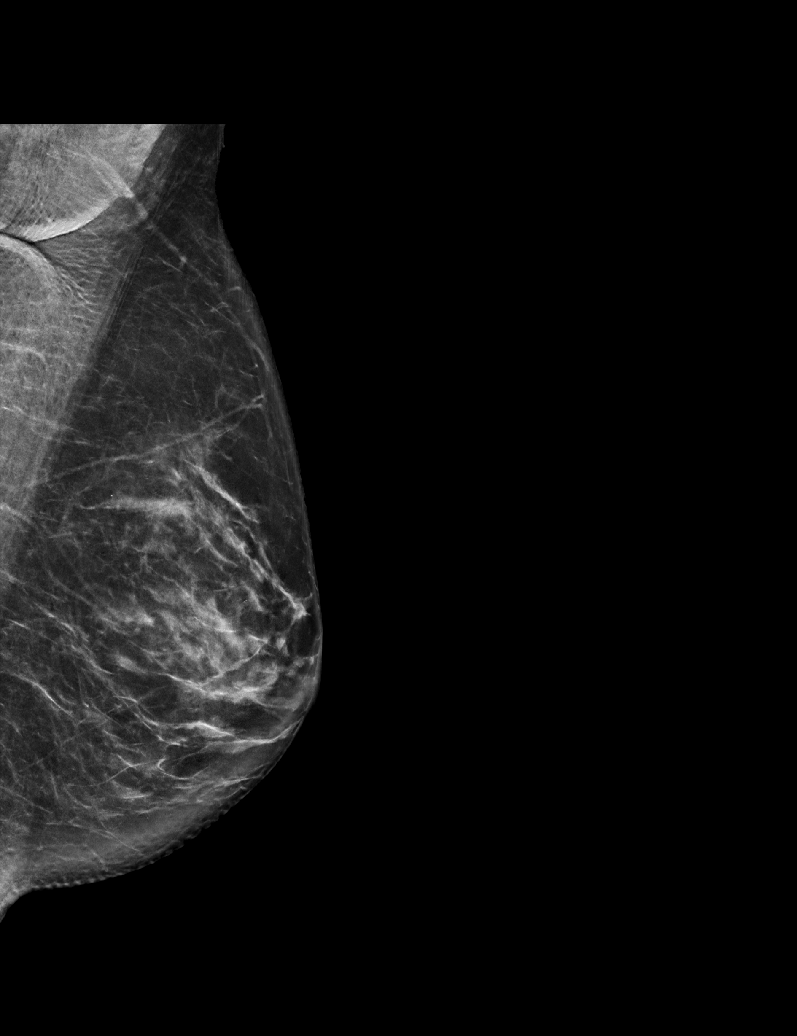

[L MLO tomo (1 of 2) · tomo slice 29/57.0]
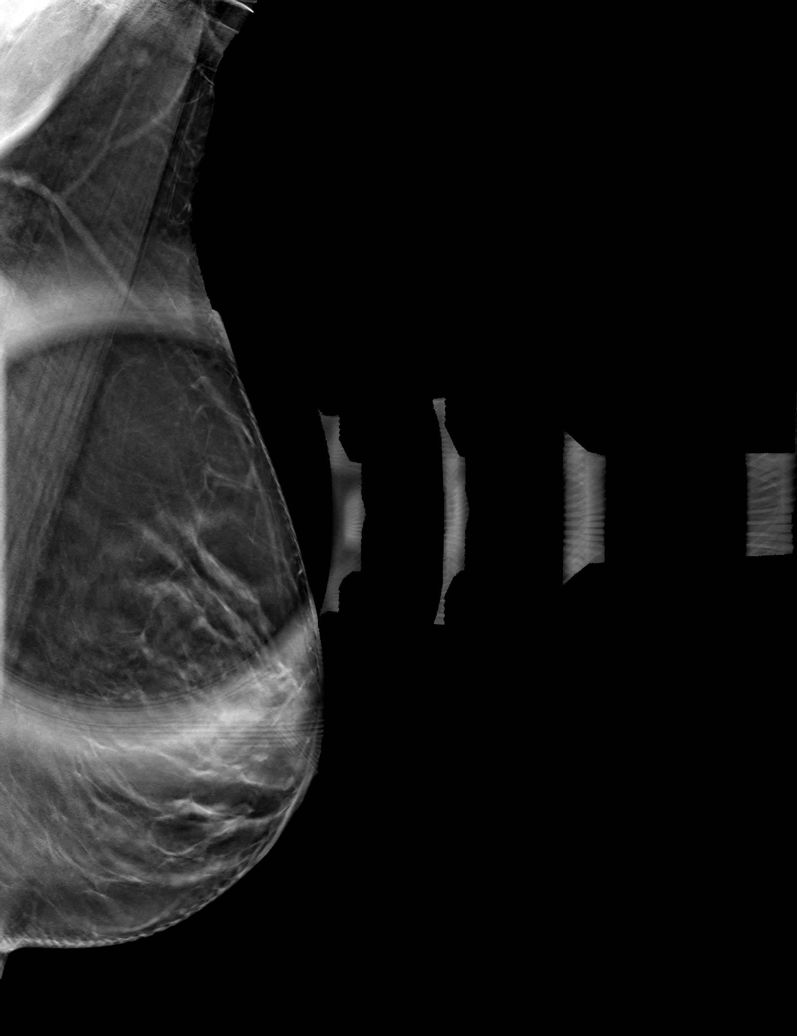

[L ML tomo · tomo slice 35/68.0]
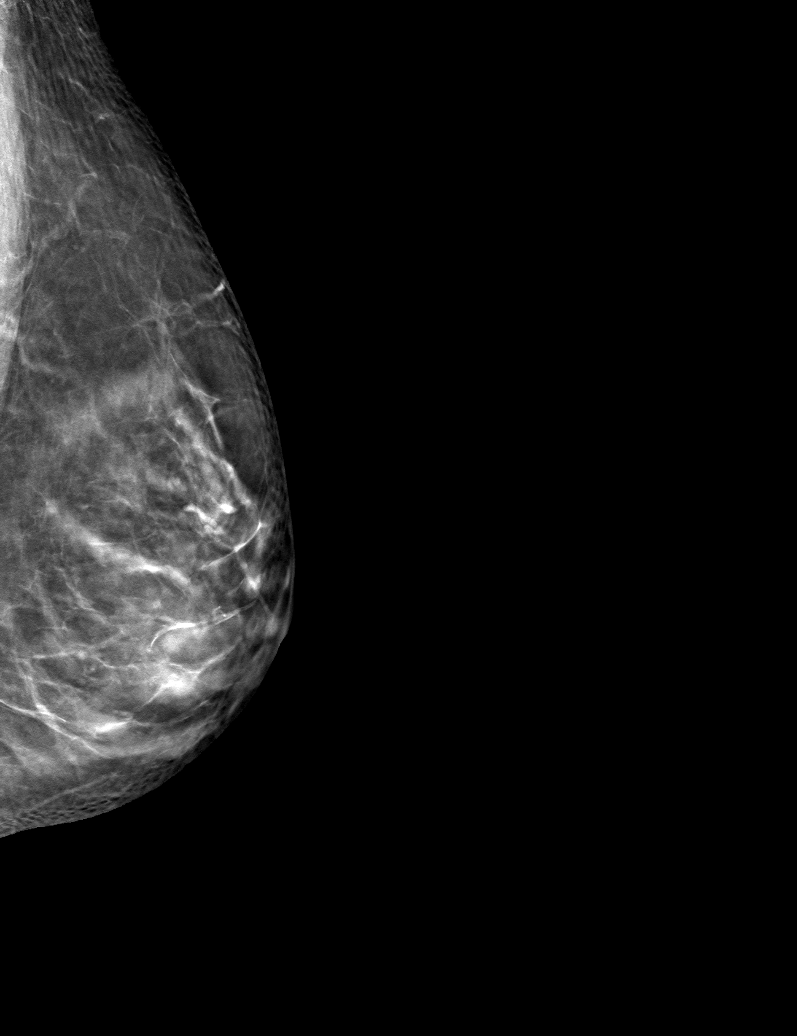

[L MLO tomo (2 of 2) · tomo slice 37/72.0]
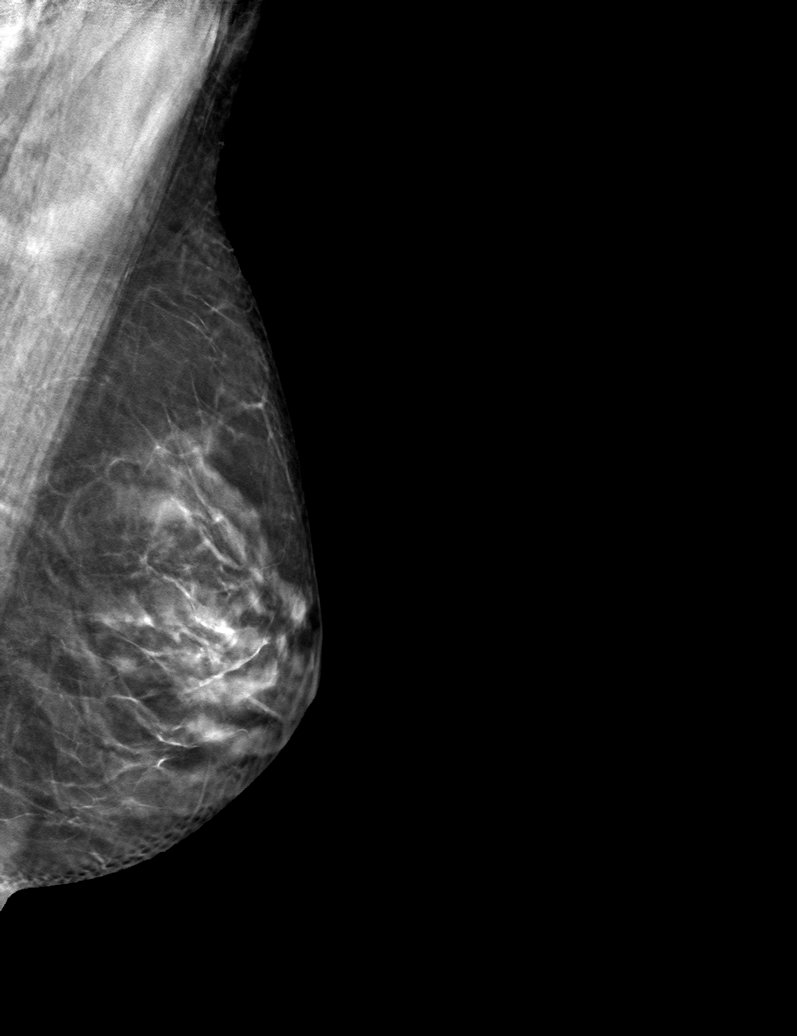

[6 of 18 positions shown; findings below may reference images not displayed]

ACR Breast Density Category c: The breast tissue is heterogeneously
dense, which may obscure small masses.
FINDINGS: On today's additional diagnostic views, including spot compression
with 3D tomosynthesis, there is no persistent asymmetry within the
upper LEFT breast indicating superimposition of normal dense
fibroglandular tissues.
IMPRESSION: No evidence of malignancy.

Patient may return to routine annual bilateral screening mammogram
schedule.

RECOMMENDATION:
Screening mammogram in one year.(Code:[id number])

I have discussed the findings and recommendations with the patient.
If applicable, a reminder letter will be sent to the patient
regarding the next appointment.

BI-RADS CATEGORY  1: Negative.

## 2022-11-11 ENCOUNTER — Telehealth: Payer: Self-pay | Admitting: Internal Medicine

## 2022-11-11 NOTE — Telephone Encounter (Signed)
Pt has a cold and thyroid disease and wants to know if she can take alkaseltezer plus severe cold and flu. On the back of bottle it says check with doctor if you have thyroid issues to see if its ok.

## 2022-11-12 NOTE — Telephone Encounter (Signed)
Pt was notified.  

## 2023-01-15 DIAGNOSIS — H43813 Vitreous degeneration, bilateral: Secondary | ICD-10-CM | POA: Diagnosis not present

## 2023-01-19 DIAGNOSIS — H43813 Vitreous degeneration, bilateral: Secondary | ICD-10-CM | POA: Diagnosis not present

## 2023-02-06 DIAGNOSIS — H43813 Vitreous degeneration, bilateral: Secondary | ICD-10-CM | POA: Diagnosis not present

## 2023-02-16 ENCOUNTER — Encounter: Payer: BC Managed Care – PPO | Admitting: Family Medicine

## 2023-02-16 DIAGNOSIS — D2271 Melanocytic nevi of right lower limb, including hip: Secondary | ICD-10-CM | POA: Diagnosis not present

## 2023-02-16 DIAGNOSIS — L738 Other specified follicular disorders: Secondary | ICD-10-CM | POA: Diagnosis not present

## 2023-02-16 DIAGNOSIS — L309 Dermatitis, unspecified: Secondary | ICD-10-CM | POA: Diagnosis not present

## 2023-02-16 DIAGNOSIS — D225 Melanocytic nevi of trunk: Secondary | ICD-10-CM | POA: Diagnosis not present

## 2023-02-24 ENCOUNTER — Encounter: Payer: Self-pay | Admitting: Family Medicine

## 2023-02-24 ENCOUNTER — Ambulatory Visit (INDEPENDENT_AMBULATORY_CARE_PROVIDER_SITE_OTHER): Payer: BC Managed Care – PPO | Admitting: Family Medicine

## 2023-02-24 VITALS — BP 120/80 | HR 82 | Temp 97.7°F | Resp 16 | Ht 62.75 in | Wt 130.2 lb

## 2023-02-24 DIAGNOSIS — Z0000 Encounter for general adult medical examination without abnormal findings: Secondary | ICD-10-CM

## 2023-02-24 DIAGNOSIS — E785 Hyperlipidemia, unspecified: Secondary | ICD-10-CM

## 2023-02-24 DIAGNOSIS — K219 Gastro-esophageal reflux disease without esophagitis: Secondary | ICD-10-CM

## 2023-02-24 DIAGNOSIS — E039 Hypothyroidism, unspecified: Secondary | ICD-10-CM

## 2023-02-24 DIAGNOSIS — M8580 Other specified disorders of bone density and structure, unspecified site: Secondary | ICD-10-CM | POA: Diagnosis not present

## 2023-02-24 DIAGNOSIS — D509 Iron deficiency anemia, unspecified: Secondary | ICD-10-CM | POA: Diagnosis not present

## 2023-02-24 LAB — COMPREHENSIVE METABOLIC PANEL

## 2023-02-24 LAB — CBC WITH DIFFERENTIAL/PLATELET
Basos: 1 %
Hematocrit: 41.2 % (ref 34.0–46.6)
Immature Granulocytes: 0 %
Lymphocytes Absolute: 2.7 10*3/uL (ref 0.7–3.1)
Lymphs: 30 %

## 2023-02-24 MED ORDER — SIMVASTATIN 20 MG PO TABS: 20.0000 mg | ORAL_TABLET | Freq: Every day | ORAL | 3 refills | Status: DC

## 2023-02-24 MED ORDER — LEVOTHYROXINE SODIUM 75 MCG PO TABS
75.0000 ug | ORAL_TABLET | Freq: Every day | ORAL | 3 refills | Status: DC
Start: 2023-02-24 — End: 2023-04-28

## 2023-02-24 NOTE — Progress Notes (Signed)
Complete physical exam  Patient: Bonnie Hensley   DOB: November 13, 1960   62 y.o. Female  MRN: [id number]  Subjective:    Chief Complaint  Patient presents with   Annual Exam    Fasting.     Bonnie Hensley is a 62 y.o. female who presents today for a complete physical exam. She reports consuming a general diet. Gym/ health club routine includes cardio. She generally feels fairly well. She reports sleeping poorly. She does not have additional problems to discuss today.  She continues on her thyroid medication as well as Zocor and having no difficulty with them.  She is on a multivitamin and extra calcium.  She exercises regularly.  She does not smoke and drinking is not an issue.  Her marriage is going well as his her work life.  Otherwise her family and social history as well as health maintenance and immunizations was reviewed.   Most recent fall risk assessment:    02/24/2023    3:25 PM  Fall Risk   Falls in the past year? 0  Number falls in past yr: 0  Injury with Fall? 0  Risk for fall due to : No Fall Risks  Follow up Falls evaluation completed     Most recent depression screenings:    02/24/2023    3:25 PM 01/30/2022   10:27 AM  PHQ 2/9 Scores  PHQ - 2 Score 0 0    Vision:Within last year and Dental: Receives regular dental care    Patient Care Team: Ronnald Nian, MD as PCP - General   Outpatient Medications Prior to Visit  Medication Sig   Calcium Carbonate-Vitamin D 600-400 MG-UNIT chew tablet    co-enzyme Q-10 30 MG capsule Take 30 mg by mouth 3 (three) times daily.   Multiple Vitamin (MULTIVITAMIN) tablet Take 1 tablet by mouth daily.   [DISCONTINUED] levothyroxine (SYNTHROID) 75 MCG tablet Take 1 tablet (75 mcg total) by mouth daily.   [DISCONTINUED] simvastatin (ZOCOR) 20 MG tablet Take 1 tablet (20 mg total) by mouth daily.   No facility-administered medications prior to visit.    Review of Systems  All other systems reviewed and are  negative.         Objective:    BP 120/80   Pulse 82   Temp 97.7 F (36.5 C) (Oral)   Resp 16   Ht 5' 2.75" (1.594 m)   Wt 130 lb 3.2 oz (59.1 kg)   LMP 10/10/2011   SpO2 97% Comment: room air  BMI 23.25 kg/m    Physical Exam  Alert and in no distress. Tympanic membranes and canals are normal. Pharyngeal area is normal. Neck is supple without adenopathy or thyromegaly. Cardiac exam shows a regular sinus rhythm without murmurs or gallops. Lungs are clear to auscultation.      Assessment & Plan:    Routine general medical examination at a health care facility - Plan: Lipid panel, Comprehensive metabolic panel, CBC with Differential/Platelet  Gastroesophageal reflux disease without esophagitis  Hypothyroidism, unspecified type - Plan: TSH, levothyroxine (SYNTHROID) 75 MCG tablet  Osteopenia, unspecified location - Plan: VITAMIN D 25 Hydroxy (Vit-D Deficiency, Fractures)  Iron deficiency anemia, unspecified iron deficiency anemia type - Plan: Comprehensive metabolic panel, CBC with Differential/Platelet  Hyperlipidemia with target LDL less than 100 - Plan: Lipid panel, simvastatin (ZOCOR) 20 MG tablet  Immunization History  Administered Date(s) Administered   DTaP 08/15/1959, 10/03/1959, 11/01/1959, 05/14/1960   DTaP / IPV 01/18/2015   IPV  11/01/1959, 05/14/1960, 03/20/1965, 06/25/1970   Influenza Split 07/30/2011   Influenza,inj,Quad PF,6+ Mos 07/05/2014, 06/12/2020   Influenza-Unspecified 06/15/2014, 06/05/2021, 06/03/2022   Moderna Covid-19 Vaccine Bivalent Booster 13yrs & up 07/12/2021   Moderna Sars-Covid-2 Vaccination 11/18/2019, 12/23/2019, 07/30/2020, 02/12/2021   Smallpox 11/29/1959   Td 01/29/2004   Tdap 01/18/2015   Typhoid Inactivated 03/05/1960, 04/02/1960, 04/10/1960   Typhoid Parenteral 03/26/1965   Zoster Recombinat (Shingrix) 04/10/2020, 06/12/2020   Zoster, Live 10/18/2013    Health Maintenance  Topic Date Due   HIV Screening  Never done    MAMMOGRAM  04/05/2022   COVID-19 Vaccine (6 - 2023-24 season) 05/16/2022   INFLUENZA VACCINE  04/16/2023   DTaP/Tdap/Td (8 - Td or Tdap) 01/17/2025   PAP SMEAR-Modifier  04/05/2025   Colonoscopy  08/02/2030   Hepatitis C Screening  Completed   Zoster Vaccines- Shingrix  Completed   HPV VACCINES  Aged Out    Recommend she continue on her present medication regimen as well as her exercise regimen.  Synthroid and Zocor was renewed. Problem List Items Addressed This Visit     RESOLVED: Anemia   Relevant Orders   Comprehensive metabolic panel   CBC with Differential/Platelet   Gastroesophageal reflux disease without esophagitis   Hyperlipidemia with target LDL less than 100   Relevant Medications   simvastatin (ZOCOR) 20 MG tablet   Other Relevant Orders   Lipid panel   Hypothyroid   Relevant Medications   levothyroxine (SYNTHROID) 75 MCG tablet   Other Relevant Orders   TSH   Osteopenia   Relevant Orders   VITAMIN D 25 Hydroxy (Vit-D Deficiency, Fractures)   Other Visit Diagnoses     Routine general medical examination at a health care facility    -  Primary   Relevant Orders   Lipid panel   Comprehensive metabolic panel   CBC with Differential/Platelet      Recheck here in 1 year.    Sharlot Gowda, MD

## 2023-02-25 LAB — CBC WITH DIFFERENTIAL/PLATELET
Basophils Absolute: 0.1 10*3/uL (ref 0.0–0.2)
EOS (ABSOLUTE): 0.4 10*3/uL (ref 0.0–0.4)
Eos: 4 %
Hemoglobin: 13.6 g/dL (ref 11.1–15.9)
Immature Grans (Abs): 0 10*3/uL (ref 0.0–0.1)
MCH: 29.1 pg (ref 26.6–33.0)
MCHC: 33 g/dL (ref 31.5–35.7)
MCV: 88 fL (ref 79–97)
Monocytes Absolute: 0.6 10*3/uL (ref 0.1–0.9)
Monocytes: 6 %
Neutrophils Absolute: 5.2 10*3/uL (ref 1.4–7.0)
Neutrophils: 59 %
Platelets: 300 10*3/uL (ref 150–450)
RBC: 4.67 x10E6/uL (ref 3.77–5.28)
RDW: 14.2 % (ref 11.7–15.4)
WBC: 8.9 10*3/uL (ref 3.4–10.8)

## 2023-02-25 LAB — COMPREHENSIVE METABOLIC PANEL
ALT: 23 IU/L (ref 0–32)
Albumin/Globulin Ratio: 1.8
Albumin: 4.8 g/dL (ref 3.9–4.9)
BUN/Creatinine Ratio: 22 (ref 12–28)
BUN: 18 mg/dL (ref 8–27)
Bilirubin Total: 0.2 mg/dL (ref 0.0–1.2)
CO2: 24 mmol/L (ref 20–29)
Calcium: 10.2 mg/dL (ref 8.7–10.3)
Chloride: 101 mmol/L (ref 96–106)
Globulin, Total: 2.6 g/dL (ref 1.5–4.5)
Glucose: 88 mg/dL (ref 70–99)
Sodium: 139 mmol/L (ref 134–144)
Total Protein: 7.4 g/dL (ref 6.0–8.5)
eGFR: 80 mL/min/{1.73_m2} (ref >59)

## 2023-02-25 LAB — LIPID PANEL
Chol/HDL Ratio: 2 ratio (ref 0.0–4.4)
Cholesterol, Total: 180 mg/dL (ref 100–199)
HDL: 91 mg/dL (ref >39)
LDL Chol Calc (NIH): 78 mg/dL (ref 0–99)
Triglycerides: 54 mg/dL (ref 0–149)
VLDL Cholesterol Cal: 11 mg/dL (ref 5–40)

## 2023-02-25 LAB — VITAMIN D 25 HYDROXY (VIT D DEFICIENCY, FRACTURES): Vit D, 25-Hydroxy: 68.3 ng/mL (ref 30.0–100.0)

## 2023-02-25 LAB — TSH: TSH: 7.69 u[IU]/mL — ABNORMAL HIGH (ref 0.450–4.500)

## 2023-02-25 NOTE — Addendum Note (Signed)
Addended by: Ronnald Nian on: 02/25/2023 01:31 PM   Modules accepted: Orders

## 2023-04-27 ENCOUNTER — Other Ambulatory Visit: Payer: BC Managed Care – PPO

## 2023-04-27 DIAGNOSIS — E039 Hypothyroidism, unspecified: Secondary | ICD-10-CM

## 2023-04-28 MED ORDER — LEVOTHYROXINE SODIUM 88 MCG PO TABS: 88.0000 ug | ORAL_TABLET | Freq: Every day | ORAL | 0 refills | Status: DC

## 2023-04-28 NOTE — Addendum Note (Signed)
Addended by: Ronnald Nian on: 04/28/2023 07:29 PM   Modules accepted: Orders

## 2023-05-01 ENCOUNTER — Telehealth: Payer: Self-pay

## 2023-05-01 NOTE — Telephone Encounter (Signed)
Pt called back. Notified of lab results and recommendations. Her nurse visit is scheduled for 07/01/23 at 8 am.

## 2023-07-01 ENCOUNTER — Other Ambulatory Visit: Payer: BC Managed Care – PPO

## 2023-07-01 DIAGNOSIS — E039 Hypothyroidism, unspecified: Secondary | ICD-10-CM

## 2023-07-02 LAB — TSH: TSH: 2.79 u[IU]/mL (ref 0.450–4.500)

## 2023-07-02 MED ORDER — LEVOTHYROXINE SODIUM 88 MCG PO TABS
88.0000 ug | ORAL_TABLET | Freq: Every day | ORAL | 3 refills | Status: AC
Start: 2023-07-02 — End: ?

## 2023-08-12 DIAGNOSIS — Z1231 Encounter for screening mammogram for malignant neoplasm of breast: Secondary | ICD-10-CM | POA: Diagnosis not present

## 2023-08-12 DIAGNOSIS — Z01419 Encounter for gynecological examination (general) (routine) without abnormal findings: Secondary | ICD-10-CM | POA: Diagnosis not present

## 2023-11-10 ENCOUNTER — Encounter: Payer: Self-pay | Admitting: Internal Medicine

## 2024-03-02 ENCOUNTER — Encounter: Payer: Self-pay | Admitting: Family Medicine

## 2024-03-02 ENCOUNTER — Ambulatory Visit (INDEPENDENT_AMBULATORY_CARE_PROVIDER_SITE_OTHER): Payer: BC Managed Care – PPO | Admitting: Family Medicine

## 2024-03-02 VITALS — BP 116/70 | HR 91 | Ht 63.0 in | Wt 129.2 lb

## 2024-03-02 DIAGNOSIS — M8580 Other specified disorders of bone density and structure, unspecified site: Secondary | ICD-10-CM | POA: Diagnosis not present

## 2024-03-02 DIAGNOSIS — Z803 Family history of malignant neoplasm of breast: Secondary | ICD-10-CM

## 2024-03-02 DIAGNOSIS — E785 Hyperlipidemia, unspecified: Secondary | ICD-10-CM | POA: Diagnosis not present

## 2024-03-02 DIAGNOSIS — Z0000 Encounter for general adult medical examination without abnormal findings: Secondary | ICD-10-CM | POA: Diagnosis not present

## 2024-03-02 DIAGNOSIS — E039 Hypothyroidism, unspecified: Secondary | ICD-10-CM

## 2024-03-02 DIAGNOSIS — K219 Gastro-esophageal reflux disease without esophagitis: Secondary | ICD-10-CM

## 2024-03-02 DIAGNOSIS — Z23 Encounter for immunization: Secondary | ICD-10-CM | POA: Diagnosis not present

## 2024-03-02 DIAGNOSIS — E038 Other specified hypothyroidism: Secondary | ICD-10-CM

## 2024-03-02 MED ORDER — LEVOTHYROXINE SODIUM 88 MCG PO TABS: 88.0000 ug | ORAL_TABLET | Freq: Every day | ORAL | 3 refills | Status: AC

## 2024-03-02 MED ORDER — SIMVASTATIN 20 MG PO TABS: 20.0000 mg | ORAL_TABLET | Freq: Every day | ORAL | 3 refills | Status: AC

## 2024-03-02 NOTE — Progress Notes (Signed)
 Complete physical exam  Patient: Bonnie Hensley   DOB: 05/01/1961   63 y.o. Female  MRN: [id number]  Subjective:    Chief Complaint  Patient presents with   Annual Exam    Cpe. Starts feeling itchy in different areas it will turn red and than go away.     Bonnie Hensley is a 63 y.o. female who presents today for a complete physical exam.  She reports consuming a general diet. Gym/ health club routine includes cardio, light weights, and treadmill. walking She generally feels good . She reports sleeping poorly.  She did miss some of the thyroid  medicine and started taking the 75 mcg instead of 88 over the last couple weeks.  She continues on Zocor .  She is also taking a multivitamin.  She has a history of osteopenia.  She is not having any difficulty with reflux.  She does get regular mammograms and will be checking only her next Pap and pelvic is.  She also states that she has had a rash that has occurred on various parts of her body but does tend to go away.  She cannot associate this with any particular cause.  Most recent fall risk assessment:    03/02/2024    8:11 AM  Fall Risk   Falls in the past year? 0  Number falls in past yr: 0  Injury with Fall? 0  Risk for fall due to : No Fall Risks  Follow up Falls evaluation completed     Most recent depression screenings:    03/02/2024    8:11 AM 02/24/2023    3:25 PM  PHQ 2/9 Scores  PHQ - 2 Score 0 0    Vision:Within last year and Dental: No current dental problems and Receives regular dental care     Immunization History  Administered Date(s) Administered   DTaP 08/15/1959, 10/03/1959, 11/01/1959, 05/14/1960   DTaP / IPV 01/18/2015   IPV 11/01/1959, 05/14/1960, 03/20/1965, 06/25/1970   Influenza Split 07/30/2011   Influenza,inj,Quad PF,6+ Mos 07/05/2014, 06/12/2020   Influenza-Unspecified 06/15/2014, 06/05/2021, 06/03/2022, 06/02/2023   Moderna Covid-19 Vaccine Bivalent Booster 41yrs & up 07/12/2021   Moderna  Sars-Covid-2 Vaccination 11/18/2019, 12/23/2019, 07/30/2020, 02/12/2021   Smallpox 11/29/1959   Td 01/29/2004   Tdap 01/18/2015   Typhoid Inactivated 03/05/1960, 04/02/1960, 04/10/1960   Typhoid Parenteral 03/26/1965   Zoster Recombinant(Shingrix ) 04/10/2020, 06/12/2020   Zoster, Live 10/18/2013    Health Maintenance  Topic Date Due   HIV Screening  Never done   MAMMOGRAM  04/05/2022   COVID-19 Vaccine (6 - 2024-25 season) 05/17/2023   INFLUENZA VACCINE  04/15/2024   DTaP/Tdap/Td (8 - Td or Tdap) 01/17/2025   Cervical Cancer Screening (HPV/Pap Cotest)  04/05/2025   Colonoscopy  08/02/2030   Hepatitis C Screening  Completed   Zoster Vaccines- Shingrix   Completed   HPV VACCINES  Aged Out   Meningococcal B Vaccine  Aged Out    Patient Care Team: Watson Hacking, MD as PCP - General   Outpatient Medications Prior to Visit  Medication Sig   Calcium Carbonate-Vitamin D  600-400 MG-UNIT chew tablet    co-enzyme Q-10 30 MG capsule Take 30 mg by mouth 3 (three) times daily.   Multiple Vitamin (MULTIVITAMIN) tablet Take 1 tablet by mouth daily.   [DISCONTINUED] levothyroxine  (SYNTHROID ) 88 MCG tablet Take 1 tablet (88 mcg total) by mouth daily.   [DISCONTINUED] simvastatin  (ZOCOR ) 20 MG tablet Take 1 tablet (20 mg total) by mouth daily.   No  facility-administered medications prior to visit.    Review of Systems  All other systems reviewed and are negative.   Family and social history as well as health maintenance and immunizations was reviewed.     Objective:    BP 116/70   Pulse 91   Ht 5' 3 (1.6 m)   Wt 129 lb 3.2 oz (58.6 kg)   LMP 10/10/2011   SpO2 96%   BMI 22.89 kg/m     Physical Exam   Alert and in no distress. Tympanic membranes and canals are normal. Pharyngeal area is normal. Neck is supple without adenopathy or thyromegaly. Cardiac exam shows a regular sinus rhythm without murmurs or gallops. Lungs are clear to auscultation.       Assessment & Plan:      Routine general medical examination at a health care facility  Osteopenia, unspecified location - Plan: CBC with Differential/Platelet, Comprehensive metabolic panel with GFR, DG Bone Density, VITAMIN D  25 Hydroxy (Vit-D Deficiency, Fractures)  Other specified hypothyroidism - Plan: TSH  Hyperlipidemia with target LDL less than 100 - Plan: Lipid panel, simvastatin  (ZOCOR ) 20 MG tablet  Gastroesophageal reflux disease without esophagitis  Family history of malignant neoplasm of breast  Need for vaccination against Streptococcus pneumoniae - Plan: Pneumococcal conjugate vaccine 20-valent (Prevnar 20)  Hypothyroidism, unspecified type - Plan: levothyroxine  (SYNTHROID ) 88 MCG tablet  I encouraged her to remain physically active which she is doing.  She will also pay attention to the rash to see if there is any particular inciting event.  I will check that her TSH especially in regard to the fact that she lowered her Synthroid  dosing. Return in about 1 year (around 03/02/2025).      Ron Cobbs, MD

## 2024-03-02 NOTE — Patient Instructions (Signed)
 Pay attention to the rash in terms of what, when, where and why.  Possibly even take a picture of it

## 2024-03-03 ENCOUNTER — Other Ambulatory Visit: Payer: Self-pay | Admitting: Family Medicine

## 2024-03-03 ENCOUNTER — Ambulatory Visit: Payer: Self-pay | Admitting: Family Medicine

## 2024-03-03 DIAGNOSIS — E785 Hyperlipidemia, unspecified: Secondary | ICD-10-CM

## 2024-03-03 LAB — CBC WITH DIFFERENTIAL/PLATELET
Basophils Absolute: 0.1 10*3/uL (ref 0.0–0.2)
Basos: 1 %
EOS (ABSOLUTE): 0.4 10*3/uL (ref 0.0–0.4)
Eos: 5 %
Hematocrit: 41.6 % (ref 34.0–46.6)
Hemoglobin: 13.5 g/dL (ref 11.1–15.9)
Immature Grans (Abs): 0 10*3/uL (ref 0.0–0.1)
Immature Granulocytes: 0 %
Lymphocytes Absolute: 2.3 10*3/uL (ref 0.7–3.1)
Lymphs: 27 %
MCH: 29.9 pg (ref 26.6–33.0)
MCHC: 32.5 g/dL (ref 31.5–35.7)
MCV: 92 fL (ref 79–97)
Monocytes Absolute: 0.6 10*3/uL (ref 0.1–0.9)
Monocytes: 7 %
Neutrophils Absolute: 5.2 10*3/uL (ref 1.4–7.0)
Neutrophils: 60 %
Platelets: 322 10*3/uL (ref 150–450)
RBC: 4.51 x10E6/uL (ref 3.77–5.28)
RDW: 13.3 % (ref 11.7–15.4)
WBC: 8.7 10*3/uL (ref 3.4–10.8)

## 2024-03-03 LAB — TSH: TSH: 10.8 u[IU]/mL — ABNORMAL HIGH (ref 0.450–4.500)

## 2024-03-03 LAB — COMPREHENSIVE METABOLIC PANEL WITH GFR
ALT: 23 IU/L (ref 0–32)
AST: 30 IU/L (ref 0–40)
Albumin: 4.6 g/dL (ref 3.9–4.9)
Alkaline Phosphatase: 98 IU/L (ref 44–121)
BUN/Creatinine Ratio: 22 (ref 12–28)
BUN: 18 mg/dL (ref 8–27)
Bilirubin Total: 0.3 mg/dL (ref 0.0–1.2)
CO2: 21 mmol/L (ref 20–29)
Calcium: 10.3 mg/dL (ref 8.7–10.3)
Chloride: 101 mmol/L (ref 96–106)
Creatinine, Ser: 0.81 mg/dL (ref 0.57–1.00)
Globulin, Total: 2.6 g/dL (ref 1.5–4.5)
Glucose: 93 mg/dL (ref 70–99)
Potassium: 4.4 mmol/L (ref 3.5–5.2)
Sodium: 140 mmol/L (ref 134–144)
Total Protein: 7.2 g/dL (ref 6.0–8.5)
eGFR: 81 mL/min/{1.73_m2} (ref >59)

## 2024-03-03 LAB — LIPID PANEL
Chol/HDL Ratio: 2.1 ratio (ref 0.0–4.4)
Cholesterol, Total: 190 mg/dL (ref 100–199)
HDL: 90 mg/dL (ref >39)
LDL Chol Calc (NIH): 89 mg/dL (ref 0–99)
Triglycerides: 56 mg/dL (ref 0–149)
VLDL Cholesterol Cal: 11 mg/dL (ref 5–40)

## 2024-03-03 LAB — VITAMIN D 25 HYDROXY (VIT D DEFICIENCY, FRACTURES): Vit D, 25-Hydroxy: 78 ng/mL (ref 30.0–100.0)

## 2024-04-18 DIAGNOSIS — D225 Melanocytic nevi of trunk: Secondary | ICD-10-CM | POA: Diagnosis not present

## 2024-04-18 DIAGNOSIS — L812 Freckles: Secondary | ICD-10-CM | POA: Diagnosis not present

## 2024-04-18 DIAGNOSIS — D2261 Melanocytic nevi of right upper limb, including shoulder: Secondary | ICD-10-CM | POA: Diagnosis not present

## 2024-04-18 DIAGNOSIS — L821 Other seborrheic keratosis: Secondary | ICD-10-CM | POA: Diagnosis not present

## 2024-07-05 ENCOUNTER — Ambulatory Visit: Payer: Self-pay

## 2024-08-30 DIAGNOSIS — Z01419 Encounter for gynecological examination (general) (routine) without abnormal findings: Secondary | ICD-10-CM | POA: Diagnosis not present

## 2024-08-30 DIAGNOSIS — Z1231 Encounter for screening mammogram for malignant neoplasm of breast: Secondary | ICD-10-CM | POA: Diagnosis not present

## 2024-09-06 ENCOUNTER — Telehealth: Payer: Self-pay

## 2024-09-06 NOTE — Telephone Encounter (Signed)
 Faxed form to okay switch to [phone]  Copied from CRM #[id number]. Topic: Clinical - Prescription Issue >> Sep 06, 2024 10:43 AM Wess RAMAN wrote: Reason for CRM: Pharmacy would like the verbal okay to switch patient's levothyroxine  (SYNTHROID ) 88 MCG tablet to manufacturer brand.  Pharmacy: [facility] [id number] - [place], [place] - [address] [place] [address] Phone: [phone] Fax: [phone] Hours: Not open 24 hours

## 2025-03-02 ENCOUNTER — Encounter: Payer: Self-pay | Admitting: Family Medicine
